# Patient Record
Sex: Female | Born: 2000 | Race: White | Hispanic: No | Marital: Single | Smoking: Never smoker
Health system: Southern US, Community
[De-identification: ages and names within clinical notes are randomized; demographics above are authoritative.]

## PROBLEM LIST (undated history)

## (undated) DIAGNOSIS — F419 Anxiety disorder, unspecified: Secondary | ICD-10-CM

## (undated) DIAGNOSIS — Z789 Other specified health status: Secondary | ICD-10-CM

## (undated) HISTORY — PX: FOOT SURGERY: SHX648

## (undated) HISTORY — DX: Other specified health status: Z78.9

## (undated) HISTORY — PX: TONSILLECTOMY: SHX5217

---
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
## 2022-08-18 DIAGNOSIS — R002 Palpitations: Secondary | ICD-10-CM | POA: Diagnosis not present

## 2022-08-18 DIAGNOSIS — N760 Acute vaginitis: Secondary | ICD-10-CM | POA: Diagnosis not present

## 2022-08-18 DIAGNOSIS — R52 Pain, unspecified: Secondary | ICD-10-CM | POA: Diagnosis not present

## 2022-11-19 DIAGNOSIS — O219 Vomiting of pregnancy, unspecified: Secondary | ICD-10-CM | POA: Diagnosis not present

## 2022-11-19 DIAGNOSIS — R55 Syncope and collapse: Secondary | ICD-10-CM | POA: Diagnosis not present

## 2022-11-19 DIAGNOSIS — Z3491 Encounter for supervision of normal pregnancy, unspecified, first trimester: Secondary | ICD-10-CM | POA: Diagnosis not present

## 2022-12-02 ENCOUNTER — Encounter: Payer: Medicaid Other | Admitting: Licensed Practical Nurse

## 2022-12-07 ENCOUNTER — Ambulatory Visit (INDEPENDENT_AMBULATORY_CARE_PROVIDER_SITE_OTHER): Payer: Medicaid Other | Admitting: Certified Nurse Midwife

## 2022-12-07 ENCOUNTER — Encounter: Payer: Self-pay | Admitting: Certified Nurse Midwife

## 2022-12-07 VITALS — BP 116/76 | HR 85 | Ht 66.0 in | Wt 151.8 lb

## 2022-12-07 DIAGNOSIS — Z3201 Encounter for pregnancy test, result positive: Secondary | ICD-10-CM

## 2022-12-07 DIAGNOSIS — N912 Amenorrhea, unspecified: Secondary | ICD-10-CM

## 2022-12-07 LAB — POCT URINE PREGNANCY: Preg Test, Ur: POSITIVE — AB

## 2022-12-07 MED ORDER — DICLEGIS 10-10 MG PO TBEC: 1.0000 | DELAYED_RELEASE_TABLET | Freq: Four times a day (QID) | ORAL | 6 refills | Status: DC | PRN

## 2022-12-07 NOTE — Patient Instructions (Signed)

## 2022-12-07 NOTE — Progress Notes (Signed)
Subjective:    Suzanne Haynes is a 22 y.o. female who presents for evaluation of amenorrhea. She believes she could be pregnant. Pregnancy is desired. Sexual Activity: single partner, contraception: none. Current symptoms also include: breast tenderness, fatigue, and nausea. Last period was normal.   No LMP recorded. The following portions of the patient's history were reviewed and updated as appropriate: allergies, current medications, past family history, past medical history, past social history, past surgical history, and problem list.  Review of Systems Pertinent items are noted in HPI.     Objective:    There were no vitals taken for this visit. General: alert, cooperative, appears stated age, and no acute distress    Lab Review Urine HCG: positive    Assessment:    Absence of menstruation.     Plan:    Pregnancy Test:  Positive: EDC: 07/25/23. Briefly discussed pre-natal care options. Encouraged well-balanced diet, plenty of rest when needed, pre-natal vitamins daily and walking for exercise. Discussed self-help for nausea, avoiding OTC medications until consulting provider or pharmacist, other than Tylenol as needed, minimal caffeine (1-2 cups daily) and avoiding alcohol. She will schedule her u/s for dating 1 wk her nurse visit @ [redacted] wks pregnant, and her initial NOB visit at [redacted] wk pregnant.  Feel free to call with any questions.   Doreene Burke, CNM

## 2022-12-08 ENCOUNTER — Telehealth: Payer: Self-pay

## 2022-12-13 ENCOUNTER — Ambulatory Visit
Admission: RE | Admit: 2022-12-13 | Discharge: 2022-12-13 | Disposition: A | Discharge status: Home / Self Care | Payer: Medicaid Other | Source: Ambulatory Visit | Attending: Certified Nurse Midwife | Admitting: Certified Nurse Midwife

## 2022-12-13 DIAGNOSIS — Z3A08 8 weeks gestation of pregnancy: Secondary | ICD-10-CM | POA: Diagnosis not present

## 2022-12-13 DIAGNOSIS — N912 Amenorrhea, unspecified: Secondary | ICD-10-CM | POA: Insufficient documentation

## 2022-12-13 DIAGNOSIS — Z3682 Encounter for antenatal screening for nuchal translucency: Secondary | ICD-10-CM | POA: Diagnosis not present

## 2022-12-13 DIAGNOSIS — Z3687 Encounter for antenatal screening for uncertain dates: Secondary | ICD-10-CM | POA: Diagnosis not present

## 2022-12-20 ENCOUNTER — Other Ambulatory Visit: Payer: Self-pay

## 2022-12-20 MED ORDER — BONJESTA 20-20 MG PO TBCR: 2.0000 | EXTENDED_RELEASE_TABLET | Freq: Every day | ORAL | 1 refills | Status: DC

## 2022-12-20 MED ORDER — ONDANSETRON 4 MG PO TBDP: 4.0000 mg | ORAL_TABLET | Freq: Three times a day (TID) | ORAL | 0 refills | Status: DC | PRN

## 2022-12-20 NOTE — Addendum Note (Signed)
Addended by: Tommie Raymond on: 12/20/2022 03:53 PM   Modules accepted: Orders

## 2022-12-20 NOTE — Telephone Encounter (Signed)
I changed RX to [facility] to see if this is covered by Medicaid.

## 2022-12-28 ENCOUNTER — Ambulatory Visit: Payer: Medicaid Other

## 2022-12-30 ENCOUNTER — Ambulatory Visit (INDEPENDENT_AMBULATORY_CARE_PROVIDER_SITE_OTHER): Payer: Medicaid Other

## 2022-12-30 VITALS — Wt 151.0 lb

## 2022-12-30 DIAGNOSIS — Z3480 Encounter for supervision of other normal pregnancy, unspecified trimester: Secondary | ICD-10-CM | POA: Insufficient documentation

## 2022-12-30 DIAGNOSIS — Z3689 Encounter for other specified antenatal screening: Secondary | ICD-10-CM

## 2022-12-30 NOTE — Patient Instructions (Signed)
Second Trimester of Pregnancy  The second trimester of pregnancy is from week 13 through week 27. This is months 4 through 6 of pregnancy. The second trimester is often a time when you feel your best. Your body has adjusted to being pregnant, and you begin to feel better physically. During the second trimester: Morning sickness has lessened or stopped completely. You may have more energy. You may have an increase in appetite. The second trimester is also a time when the unborn baby (fetus) is growing rapidly. At the end of the sixth month, the fetus may be up to 12 inches long and weigh about 1 pounds. You will likely begin to feel the baby move (quickening) between 16 and 20 weeks of pregnancy. Body changes during your second trimester Your body continues to go through many changes during your second trimester. The changes vary and generally return to normal after the baby is born. Physical changes Your weight will continue to increase. You will notice your lower abdomen bulging out. You may begin to get stretch marks on your hips, abdomen, and breasts. Your breasts will continue to grow and to become tender. Dark spots or blotches (chloasma or mask of pregnancy) may develop on your face. A dark line from your belly button to the pubic area (linea nigra) may appear. You may have changes in your hair. These can include thickening of your hair, rapid growth, and changes in texture. Some people also have hair loss during or after pregnancy, or hair that feels dry or thin. Health changes You may develop headaches. You may have heartburn. You may develop constipation. You may develop hemorrhoids or swollen, bulging veins (varicose veins). Your gums may bleed and may be sensitive to brushing and flossing. You may urinate more often because the fetus is pressing on your bladder. You may have back pain. This is caused by: Weight gain. Pregnancy hormones that are relaxing the joints in your  pelvis. A shift in weight and the muscles that support your balance. Follow these instructions at home: Medicines Follow your health care provider's instructions regarding medicine use. Specific medicines may be either safe or unsafe to take during pregnancy. Do not take any medicines unless approved by your health care provider. Take a prenatal vitamin that contains at least 600 micrograms (mcg) of folic acid. Eating and drinking Eat a healthy diet that includes fresh fruits and vegetables, whole grains, good sources of protein such as meat, eggs, or tofu, and low-fat dairy products. Avoid raw meat and unpasteurized juice, milk, and cheese. These carry germs that can harm you and your baby. You may need to take these actions to prevent or treat constipation: Drink enough fluid to keep your urine pale yellow. Eat foods that are high in fiber, such as beans, whole grains, and fresh fruits and vegetables. Limit foods that are high in fat and processed sugars, such as fried or sweet foods. Activity Exercise only as directed by your health care provider. Most people can continue their usual exercise routine during pregnancy. Try to exercise for 30 minutes at least 5 days a week. Stop exercising if you develop contractions in your uterus. Stop exercising if you develop pain or cramping in the lower abdomen or lower back. Avoid exercising if it is very hot or humid or if you are at a high altitude. Avoid heavy lifting. If you choose to, you may have sex unless your health care provider tells you not to. Relieving pain and discomfort Wear a supportive   bra to prevent discomfort from breast tenderness. Take warm sitz baths to soothe any pain or discomfort caused by hemorrhoids. Use hemorrhoid cream if your health care provider approves. Rest with your legs raised (elevated) if you have leg cramps or low back pain. If you develop varicose veins: Wear support hose as told by your health care  provider. Elevate your feet for 15 minutes, 3-4 times a day. Limit salt in your diet. Safety Wear your seat belt at all times when driving or riding in a car. Talk with your health care provider if someone is verbally or physically abusive to you. Lifestyle Do not use hot tubs, steam rooms, or saunas. Do not douche. Do not use tampons or scented sanitary pads. Avoid cat litter boxes and soil used by cats. These carry germs that can cause birth defects in the baby and possibly loss of the fetus by miscarriage or stillbirth. Do not use herbal remedies, alcohol, illegal drugs, or medicines that are not approved by your health care provider. Chemicals in these products can harm your baby. Do not use any products that contain nicotine or tobacco, such as cigarettes, e-cigarettes, and chewing tobacco. If you need help quitting, ask your health care provider. General instructions During a routine prenatal visit, your health care provider will do a physical exam and other tests. He or she will also discuss your overall health. Keep all follow-up visits. This is important. Ask your health care provider for a referral to a local prenatal education class. Ask for help if you have counseling or nutritional needs during pregnancy. Your health care provider can offer advice or refer you to specialists for help with various needs. Where to find more information American Pregnancy Association: americanpregnancy.org American College of Obstetricians and Gynecologists: acog.org/en/Womens%20Health/Pregnancy Office on Women's Health: womenshealth.gov/pregnancy Contact a health care provider if you have: A headache that does not go away when you take medicine. Vision changes or you see spots in front of your eyes. Mild pelvic cramps, pelvic pressure, or nagging pain in the abdominal area. Persistent nausea, vomiting, or diarrhea. A bad-smelling vaginal discharge or foul-smelling urine. Pain when you  urinate. Sudden or extreme swelling of your face, hands, ankles, feet, or legs. A fever. Get help right away if you: Have fluid leaking from your vagina. Have spotting or bleeding from your vagina. Have severe abdominal cramping or pain. Have difficulty breathing. Have chest pain. Have fainting spells. Have not felt your baby move for the time period told by your health care provider. Have new or increased pain, swelling, or redness in an arm or leg. Summary The second trimester of pregnancy is from week 13 through week 27 (months 4 through 6). Do not use herbal remedies, alcohol, illegal drugs, or medicines that are not approved by your health care provider. Chemicals in these products can harm your baby. Exercise only as directed by your health care provider. Most people can continue their usual exercise routine during pregnancy. Keep all follow-up visits. This is important. This information is not intended to replace advice given to you by your health care provider. Make sure you discuss any questions you have with your health care provider. Document Revised: 08/21/2019 Document Reviewed: 06/27/2019 Elsevier Patient Education  2024 [employer]. First Trimester of Pregnancy  The first trimester of pregnancy starts on the first day of your last menstrual period until the end of week 12. This is also called months 1 through 3 of pregnancy. Body changes during your first trimester Your   body goes through many changes during pregnancy. The changes usually return to normal after your baby is born. Physical changes You may gain or lose weight. Your breasts may grow larger and hurt. The area around your nipples may get darker. Dark spots or blotches may develop on your face. You may have changes in your hair. Health changes You may feel like you might vomit (nauseous), and you may vomit. You may have heartburn. You may have headaches. You may have trouble pooping (constipation). Your  gums may bleed. Other changes You may get tired easily. You may pee (urinate) more often. Your menstrual periods will stop. You may not feel hungry. You may want to eat certain kinds of food. You may have changes in your emotions from day to day. You may have more dreams. Follow these instructions at home: Medicines Take over-the-counter and prescription medicines only as told by your doctor. Some medicines are not safe during pregnancy. Take a prenatal vitamin that contains at least 600 micrograms (mcg) of folic acid. Eating and drinking Eat healthy meals that include: Fresh fruits and vegetables. Whole grains. Good sources of protein, such as meat, eggs, or tofu. Low-fat dairy products. Avoid raw meat and unpasteurized juice, milk, and cheese. If you feel like you may vomit, or you vomit: Eat 4 or 5 small meals a day instead of 3 large meals. Try eating a few soda crackers. Drink liquids between meals instead of during meals. You may need to take these actions to prevent or treat trouble pooping: Drink enough fluids to keep your pee (urine) pale yellow. Eat foods that are high in fiber. These include beans, whole grains, and fresh fruits and vegetables. Limit foods that are high in fat and sugar. These include fried or sweet foods. Activity Exercise only as told by your doctor. Most people can do their usual exercise routine during pregnancy. Stop exercising if you have cramps or pain in your lower belly (abdomen) or low back. Do not exercise if it is too hot or too humid, or if you are in a place of great height (high altitude). Avoid heavy lifting. If you choose to, you may have sex unless your doctor tells you not to. Relieving pain and discomfort Wear a good support bra if your breasts are sore. Rest with your legs raised (elevated) if you have leg cramps or low back pain. If you have bulging veins (varicose veins) in your legs: Wear support hose as told by your  doctor. Raise your feet for 15 minutes, 3-4 times a day. Limit salt in your food. Safety Wear your seat belt at all times when you are in a car. Talk with your doctor if someone is hurting you or yelling at you. Talk with your doctor if you are feeling sad or have thoughts of hurting yourself. Lifestyle Do not use hot tubs, steam rooms, or saunas. Do not douche. Do not use tampons or scented sanitary pads. Do not use herbal medicines, illegal drugs, or medicines that are not approved by your doctor. Do not drink alcohol. Do not smoke or use any products that contain nicotine or tobacco. If you need help quitting, ask your doctor. Avoid cat litter boxes and soil that is used by cats. These carry germs that can cause harm to the baby and can cause a loss of your baby by miscarriage or stillbirth. General instructions Keep all follow-up visits. This is important. Ask for help if you need counseling or if you need help with   nutrition. Your doctor can give you advice or tell you where to go for help. Visit your dentist. At home, brush your teeth with a soft toothbrush. Floss gently. Write down your questions. Take them to your prenatal visits. Where to find more information American Pregnancy Association: americanpregnancy.org American College of Obstetricians and Gynecologists: www.acog.org Office on Women's Health: womenshealth.gov/pregnancy Contact a doctor if: You are dizzy. You have a fever. You have mild cramps or pressure in your lower belly. You have a nagging pain in your belly area. You continue to feel like you may vomit, you vomit, or you have watery poop (diarrhea) for 24 hours or longer. You have a bad-smelling fluid coming from your vagina. You have pain when you pee. You are exposed to a disease that spreads from person to person, such as chickenpox, measles, Zika virus, HIV, or hepatitis. Get help right away if: You have spotting or bleeding from your vagina. You have  very bad belly cramping or pain. You have shortness of breath or chest pain. You have any kind of injury, such as from a fall or a car crash. You have new or increased pain, swelling, or redness in an arm or leg. Summary The first trimester of pregnancy starts on the first day of your last menstrual period until the end of week 12 (months 1 through 3). Eat 4 or 5 small meals a day instead of 3 large meals. Do not smoke or use any products that contain nicotine or tobacco. If you need help quitting, ask your doctor. Keep all follow-up visits. This information is not intended to replace advice given to you by your health care provider. Make sure you discuss any questions you have with your health care provider. Document Revised: 08/21/2019 Document Reviewed: 06/27/2019 Elsevier Patient Education  2024 Elsevier Inc. Commonly Asked Questions During Pregnancy  Cats: A parasite can be excreted in cat feces.  To avoid exposure you need to have another person empty the little box.  If you must empty the litter box you will need to wear gloves.  Wash your hands after handling your cat.  This parasite can also be found in raw or undercooked meat so this should also be avoided.  Colds, Sore Throats, Flu: Please check your medication sheet to see what you can take for symptoms.  If your symptoms are unrelieved by these medications please call the office.  Dental Work: Most any dental work your dentist recommends is permitted.  X-rays should only be taken during the first trimester if absolutely necessary.  Your abdomen should be shielded with a lead apron during all x-rays.  Please notify your provider prior to receiving any x-rays.  Novocaine is fine; gas is not recommended.  If your dentist requires a note from us prior to dental work please call the office and we will provide one for you.  Exercise: Exercise is an important part of staying healthy during your pregnancy.  You may continue most exercises  you were accustomed to prior to pregnancy.  Later in your pregnancy you will most likely notice you have difficulty with activities requiring balance like riding a bicycle.  It is important that you listen to your body and avoid activities that put you at a higher risk of falling.  Adequate rest and staying well hydrated are a must!  If you have questions about the safety of specific activities ask your provider.    Exposure to Children with illness: Try to avoid obvious exposure; report any   symptoms to us when noted,  If you have chicken pos, red measles or mumps, you should be immune to these diseases.   Please do not take any vaccines while pregnant unless you have checked with your OB provider.  Fetal Movement: After 28 weeks we recommend you do "kick counts" twice daily.  Lie or sit down in a calm quiet environment and count your baby movements "kicks".  You should feel your baby at least 10 times per hour.  If you have not felt 10 kicks within the first hour get up, walk around and have something sweet to eat or drink then repeat for an additional hour.  If count remains less than 10 per hour notify your provider.  Fumigating: Follow your pest control agent's advice as to how long to stay out of your home.  Ventilate the area well before re-entering.  Hemorrhoids:   Most over-the-counter preparations can be used during pregnancy.  Check your medication to see what is safe to use.  It is important to use a stool softener or fiber in your diet and to drink lots of liquids.  If hemorrhoids seem to be getting worse please call the office.   Hot Tubs:  Hot tubs Jacuzzis and saunas are not recommended while pregnant.  These increase your internal body temperature and should be avoided.  Intercourse:  Sexual intercourse is safe during pregnancy as long as you are comfortable, unless otherwise advised by your provider.  Spotting may occur after intercourse; report any bright red bleeding that is heavier  than spotting.  Labor:  If you know that you are in labor, please go to the hospital.  If you are unsure, please call the office and let us help you decide what to do.  Lifting, straining, etc:  If your job requires heavy lifting or straining please check with your provider for any limitations.  Generally, you should not lift items heavier than that you can lift simply with your hands and arms (no back muscles)  Painting:  Paint fumes do not harm your pregnancy, but may make you ill and should be avoided if possible.  Latex or water based paints have less odor than oils.  Use adequate ventilation while painting.  Permanents & Hair Color:  Chemicals in hair dyes are not recommended as they cause increase hair dryness which can increase hair loss during pregnancy.  " Highlighting" and permanents are allowed.  Dye may be absorbed differently and permanents may not hold as well during pregnancy.  Sunbathing:  Use a sunscreen, as skin burns easily during pregnancy.  Drink plenty of fluids; avoid over heating.  Tanning Beds:  Because their possible side effects are still unknown, tanning beds are not recommended.  Ultrasound Scans:  Routine ultrasounds are performed at approximately 20 weeks.  You will be able to see your baby's general anatomy an if you would like to know the gender this can usually be determined as well.  If it is questionable when you conceived you may also receive an ultrasound early in your pregnancy for dating purposes.  Otherwise ultrasound exams are not routinely performed unless there is a medical necessity.  Although you can request a scan we ask that you pay for it when conducted because insurance does not cover " patient request" scans.  Work: If your pregnancy proceeds without complications you may work until your due date, unless your physician or employer advises otherwise.  Round Ligament Pain/Pelvic Discomfort:  Sharp, shooting pains not associated   with bleeding are  fairly common, usually occurring in the second trimester of pregnancy.  They tend to be worse when standing up or when you remain standing for long periods of time.  These are the result of pressure of certain pelvic ligaments called "round ligaments".  Rest, Tylenol and heat seem to be the most effective relief.  As the womb and fetus grow, they rise out of the pelvis and the discomfort improves.  Please notify the office if your pain seems different than that described.  It may represent a more serious condition.  Common Medications Safe in Pregnancy  Acne:      Constipation:  Benzoyl Peroxide     Colace  Clindamycin      Dulcolax Suppository  Topica Erythromycin     Fibercon  Salicylic Acid      Metamucil         Miralax AVOID:        Senakot   Accutane    Cough:  Retin-A       Cough Drops  Tetracycline      Phenergan w/ Codeine if Rx  Minocycline      Robitussin (Plain & DM)  Antibiotics:     Crabs/Lice:  Ceclor       RID  Cephalosporins    AVOID:  E-Mycins      Kwell  Keflex  Macrobid/Macrodantin   Diarrhea:  Penicillin      Kao-Pectate  Zithromax      Imodium AD         PUSH FLUIDS AVOID:       Cipro     Fever:  Tetracycline      Tylenol (Regular or Extra  Minocycline       Strength)  Levaquin      Extra Strength-Do not          Exceed 8 tabs/24 hrs Caffeine:        <200mg/day (equiv. To 1 cup of coffee or  approx. 3 12 oz sodas)         Gas: Cold/Hayfever:       Gas-X  Benadryl      Mylicon  Claritin       Phazyme  **Claritin-D        Chlor-Trimeton    Headaches:  Dimetapp      ASA-Free Excedrin  Drixoral-Non-Drowsy     Cold Compress  Mucinex (Guaifenasin)     Tylenol (Regular or Extra  Sudafed/Sudafed-12 Hour     Strength)  **Sudafed PE Pseudoephedrine   Tylenol Cold & Sinus     Vicks Vapor Rub  Zyrtec  **AVOID if Problems With Blood Pressure         Heartburn: Avoid lying down for at least 1 hour after meals  Aciphex      Maalox     Rash:  Milk of  Magnesia     Benadryl    Mylanta       1% Hydrocortisone Cream  Pepcid  Pepcid Complete   Sleep Aids:  Prevacid      Ambien   Prilosec       Benadryl  Rolaids       Chamomile Tea  Tums (Limit 4/day)     Unisom         Tylenol PM         Warm milk-add vanilla or  Hemorrhoids:       Sugar for taste  Anusol/Anusol H.C.  (RX: Analapram 2.5%)  Sugar Substitutes:    Hydrocortisone OTC     Ok in moderation  Preparation H      Tucks        Vaseline lotion applied to tissue with wiping    Herpes:     Throat:  Acyclovir      Oragel  Famvir  Valtrex     Vaccines:         Flu Shot Leg Cramps:       *Gardasil  Benadryl      Hepatitis A         Hepatitis B Nasal Spray:       Pneumovax  Saline Nasal Spray     Polio Booster         Tetanus Nausea:       Tuberculosis test or PPD  Vitamin B6 25 mg TID   AVOID:    Dramamine      *Gardasil  Emetrol       Live Poliovirus  Ginger Root 250 mg QID    MMR (measles, mumps &  High Complex Carbs @ Bedtime    rebella)  Sea Bands-Accupressure    Varicella (Chickenpox)  Unisom 1/2 tab TID     *No known complications           If received before Pain:         Known pregnancy;   Darvocet       Resume series after  Lortab        Delivery  Percocet    Yeast:   Tramadol      Femstat  Tylenol 3      Gyne-lotrimin  Ultram       Monistat  Vicodin           MISC:         All Sunscreens           Hair Coloring/highlights          Insect Repellant's          (Including DEET)         Mystic Tans  

## 2022-12-30 NOTE — Progress Notes (Signed)
New OB Intake  I connected with  Suzanne Haynes on 12/30/22 at  2:15 PM EDT by telephone  and verified that I am speaking with the correct person using two identifiers. Nurse is located at [facility] and pt is located at home.  I explained I am completing New OB Intake today. We discussed her EDD of 07/25/2023 that is based on LMP of 10/18/2022. Pt is G1/P0. I reviewed her allergies, medications, Medical/Surgical/OB history, and appropriate screenings. There are cats in the home.  FOB changes the litter box.   Based on history, this is a/an pregnancy uncomplicated .   Patient Active Problem List   Diagnosis Date Noted   Supervision of other normal pregnancy, antepartum 12/30/2022    Concerns addressed today None  Delivery Plans:  Plans to deliver at [facility] [place].  Pt aware [facility] is the only hospital we deliver at.  If she goes elsewhere she will have someone deliver her that she hasn't met before.  Anatomy US Explained first scheduled [place] was done Sept 17th and an anatomy scan will be done at 20 weeks.  Labs Discussed genetic screening with patient. Patient denies genetic testing to be drawn at new OB visit. Discussed possible labs to be drawn at new OB appointment.  COVID Vaccine Patient has not had COVID vaccine.   Social Determinants of Health Food Insecurity: expresses food insecurity. Information given on local food banks. Transportation: Patient denies transportation needs.  First visit review I reviewed new OB appt with pt. I explained she will have ob bloodwork and pap smear/pelvic exam if indicated. Explained pt will be seen by Hartley Barefoot, CNM at first visit; encounter routed to appropriate provider.   Loran Senters, [facility] 12/30/2022  2:33 PM

## 2023-01-11 ENCOUNTER — Ambulatory Visit: Payer: Medicaid Other | Admitting: Certified Nurse Midwife

## 2023-01-11 ENCOUNTER — Encounter: Payer: Self-pay | Admitting: Certified Nurse Midwife

## 2023-01-11 ENCOUNTER — Other Ambulatory Visit (HOSPITAL_COMMUNITY)
Admission: RE | Admit: 2023-01-11 | Discharge: 2023-01-11 | Disposition: A | Discharge status: Home / Self Care | Payer: Medicaid Other | Source: Ambulatory Visit | Attending: Certified Nurse Midwife | Admitting: Certified Nurse Midwife

## 2023-01-11 VITALS — BP 117/57 | HR 76 | Wt 150.8 lb

## 2023-01-11 DIAGNOSIS — Z3481 Encounter for supervision of other normal pregnancy, first trimester: Secondary | ICD-10-CM | POA: Diagnosis not present

## 2023-01-11 DIAGNOSIS — Z369 Encounter for antenatal screening, unspecified: Secondary | ICD-10-CM | POA: Insufficient documentation

## 2023-01-11 DIAGNOSIS — Z114 Encounter for screening for human immunodeficiency virus [HIV]: Secondary | ICD-10-CM

## 2023-01-11 DIAGNOSIS — Z113 Encounter for screening for infections with a predominantly sexual mode of transmission: Secondary | ICD-10-CM | POA: Insufficient documentation

## 2023-01-11 DIAGNOSIS — Z3A12 12 weeks gestation of pregnancy: Secondary | ICD-10-CM | POA: Diagnosis not present

## 2023-01-11 DIAGNOSIS — Z3401 Encounter for supervision of normal first pregnancy, first trimester: Secondary | ICD-10-CM | POA: Insufficient documentation

## 2023-01-11 MED ORDER — ONDANSETRON 4 MG PO TBDP: 4.0000 mg | ORAL_TABLET | Freq: Three times a day (TID) | ORAL | 2 refills | Status: DC | PRN

## 2023-01-11 MED ORDER — PRENATAL 27-0.8 MG PO TABS: 1.0000 | ORAL_TABLET | Freq: Every day | ORAL | 4 refills | Status: DC

## 2023-01-11 NOTE — Patient Instructions (Signed)
First Trimester of Pregnancy  The first trimester of pregnancy starts on the first day of your last menstrual period until the end of week 12. This is months 1 through 3 of pregnancy. A week after a sperm fertilizes an egg, the egg will implant into the wall of the uterus and begin to develop into a baby. By the end of 12 weeks, all the baby's organs will be formed and the baby will be 2-3 inches in size. Body changes during your first trimester Your body goes through many changes during pregnancy. The changes vary and generally return to normal after your baby is born. Physical changes You may gain or lose weight. Your breasts may begin to grow larger and become tender. The tissue that surrounds your nipples (areola) may become darker. Dark spots or blotches (chloasma or mask of pregnancy) may develop on your face. You may have changes in your hair. These can include thickening or thinning of your hair or changes in texture. Health changes You may feel nauseous, and you may vomit. You may have heartburn. You may develop headaches. You may develop constipation. Your gums may bleed and may be sensitive to brushing and flossing. Other changes You may tire easily. You may urinate more often. Your menstrual periods will stop. You may have a loss of appetite. You may develop cravings for certain kinds of food. You may have changes in your emotions from day to day. You may have more vivid and strange dreams. Follow these instructions at home: Medicines Follow your health care provider's instructions regarding medicine use. Specific medicines may be either safe or unsafe to take during pregnancy. Do not take any medicines unless told to by your health care provider. Take a prenatal vitamin that contains at least 600 micrograms (mcg) of folic acid. Eating and drinking Eat a healthy diet that includes fresh fruits and vegetables, whole grains, good sources of protein such as meat, eggs, or tofu,  and low-fat dairy products. Avoid raw meat and unpasteurized juice, milk, and cheese. These carry germs that can harm you and your baby. If you feel nauseous or you vomit: Eat 4 or 5 small meals a day instead of 3 large meals. Try eating a few soda crackers. Drink liquids between meals instead of during meals. You may need to take these actions to prevent or treat constipation: Drink enough fluid to keep your urine pale yellow. Eat foods that are high in fiber, such as beans, whole grains, and fresh fruits and vegetables. Limit foods that are high in fat and processed sugars, such as fried or sweet foods. Activity Exercise only as directed by your health care provider. Most people can continue their usual exercise routine during pregnancy. Try to exercise for 30 minutes at least 5 days a week. Stop exercising if you develop pain or cramping in the lower abdomen or lower back. Avoid exercising if it is very hot or humid or if you are at high altitude. Avoid heavy lifting. If you choose to, you may have sex unless your health care provider tells you not to. Relieving pain and discomfort Wear a good support bra to relieve breast tenderness. Rest with your legs elevated if you have leg cramps or low back pain. If you develop bulging veins (varicose veins) in your legs: Wear support hose as told by your health care provider. Elevate your feet for 15 minutes, 3-4 times a day. Limit salt in your diet. Safety Wear your seat belt at all times when  driving or riding in a car. Talk with your health care provider if someone is verbally or physically abusive to you. Talk with your health care provider if you are feeling sad or have thoughts of hurting yourself. Lifestyle Do not use hot tubs, steam rooms, or saunas. Do not douche. Do not use tampons or scented sanitary pads. Do not use herbal remedies, alcohol, illegal drugs, or medicines that are not approved by your health care provider. Chemicals  in these products can harm your baby. Do not use any products that contain nicotine or tobacco, such as cigarettes, e-cigarettes, and chewing tobacco. If you need help quitting, ask your health care provider. Avoid cat litter boxes and soil used by cats. These carry germs that can cause birth defects in the baby and possibly loss of the unborn baby (fetus) by miscarriage or stillbirth. General instructions During routine prenatal visits in the first trimester, your health care provider will do a physical exam, perform necessary tests, and ask you how things are going. Keep all follow-up visits. This is important. Ask for help if you have counseling or nutritional needs during pregnancy. Your health care provider can offer advice or refer you to specialists for help with various needs. Schedule a dentist appointment. At home, brush your teeth with a soft toothbrush. Floss gently. Write down your questions. Take them to your prenatal visits. Where to find more information American Pregnancy Association: americanpregnancy.org [employer] of Obstetricians and Gynecologists: https://www.todd-brady.net/ Office on [employer] Health: MightyReward.co.nz Contact a health care provider if you have: Dizziness. A fever. Mild pelvic cramps, pelvic pressure, or nagging pain in the abdominal area. Nausea, vomiting, or diarrhea that lasts for 24 hours or longer. A bad-smelling vaginal discharge. Pain when you urinate. Known exposure to a contagious illness, such as chickenpox, measles, Zika virus, HIV, or hepatitis. Get help right away if you have: Spotting or bleeding from your vagina. Severe abdominal cramping or pain. Shortness of breath or chest pain. Any kind of trauma, such as from a fall or a car crash. New or increased pain, swelling, or redness in an arm or leg. Summary The first trimester of pregnancy starts on the first day of your last menstrual period until the end of week  12 (months 1 through 3). Eating 4 or 5 small meals a day rather than 3 large meals may help to relieve nausea and vomiting. Do not use any products that contain nicotine or tobacco, such as cigarettes, e-cigarettes, and chewing tobacco. If you need help quitting, ask your health care provider. Keep all follow-up visits. This is important. This information is not intended to replace advice given to you by your health care provider. Make sure you discuss any questions you have with your health care provider. Document Revised: 08/21/2019 Document Reviewed: 06/27/2019 Elsevier Patient Education  2024 [employer].

## 2023-01-11 NOTE — Progress Notes (Signed)
NEW OB HISTORY AND PHYSICAL  SUBJECTIVE:       Suzanne Haynes is a 22 y.o. G1P0 female, Patient's last menstrual period was 10/18/2022 (exact date)., Estimated Date of Delivery: 07/25/23, [redacted]w[redacted]d, presents today for establishment of Prenatal Care. She reports nausea, fatigue, occasional headaches, breast tenderness. Denies vaginal bleeding, dysuria, changes to vaginal discharge.   Social history Partner/Relationship: Suzanne Haynes Living situation: fiancee Work: homemaker Exercise: not regular Substance use: no T/E/D  Indications for ASA therapy (per uptodate) One of the following: Previous pregnancy with preeclampsia, especially early onset and with an adverse outcome No Multifetal gestation No Chronic hypertension No Type 1 or 2 diabetes mellitus No Chronic kidney disease No Autoimmune disease (antiphospholipid syndrome, systemic lupus erythematosus) No  Two or more of the following: Nulliparity Yes Obesity (body mass index >30 kg/m2) No Family history of preeclampsia in mother or sister No Age >=35 years No Sociodemographic characteristics (African American race, low socioeconomic level) No Personal risk factors (eg, previous pregnancy with low birth weight or small for gestational age infant, previous adverse pregnancy outcome [eg, stillbirth], interval >10 years between pregnancies) No   Gynecologic History Patient's last menstrual period was 10/18/2022 (exact date). Normal Contraception: none Last Pap: n/a due to age  Obstetric History OB History  Gravida Para Term Preterm AB Living  1            SAB IAB Ectopic Multiple Live Births               # Outcome Date GA Lbr Len/2nd Weight Sex Type Anes PTL Lv  1 Current             No past medical history on file.  Past Surgical History:  Procedure Laterality Date   FOOT SURGERY Right    growth shaved down or taken out; 3d Grade   TONSIL[age]OMY     age [age]    Current Outpatient Medications on File Prior to  Visit  Medication Sig Dispense Refill   Doxylamine-Pyridoxine (DICLEGIS) 10-10 MG TBEC Take 1 tablet by mouth as needed.     ondansetron (ZOFRAN-ODT) 4 MG disintegrating tablet Take 1 tablet (4 mg total) by mouth every 8 (eight) hours as needed for nausea or vomiting. 20 tablet 0   Prenatal Vit-Fe Fumarate-FA (MULTIVITAMIN-PRENATAL) 27-0.8 MG TABS tablet Take 1 tablet by mouth daily at 12 noon.     fluticasone (FLONASE) 50 MCG/ACT nasal spray Inhale one spray into each nostril once daily for allergy symptom control; rinse mouth and spit after use (Patient not taking: Reported on 10/26/2015) 16 g 12   LARIN FE 1.5/30 1.5-30 MG-MCG tablet Take 1 tablet by mouth once daily (Patient not taking: Reported on 12/30/2022) 84 tablet 0   No current facility-administered medications on file prior to visit.    No Known Allergies  Social History   Socioeconomic History   Marital status: Single    Spouse name: Not on file   Number of children: 0   Years of education: 11   Highest education level: Not on file  Occupational History   Occupation: unemployed  Tobacco Use   Smoking status: Never   Smokeless tobacco: Never   Tobacco comments:    mom and stepdad smoke inside and out of house  Vaping Use   Vaping status: Never Used  Substance and Sexual Activity   Alcohol use: Not Currently    Comment: occasionally   Drug use: Never   Sexual activity: Yes    Partners: Male  Birth control/protection: None  Other Topics Concern   Not on file  Social History Narrative   ** Merged History Encounter **       Social Determinants of Health   Financial Resource Strain: Low Risk  (12/30/2022)   Overall Financial Resource Strain (CARDIA)    Difficulty of Paying Living Expenses: Not very hard  Food Insecurity: Food Insecurity Present (12/30/2022)   Hunger Vital Sign    Worried About Running Out of Food in the Last Year: Sometimes true    Ran Out of Food in the Last Year: Sometimes true   Transportation Needs: No Transportation Needs (12/30/2022)   PRAPARE - Administrator, Civil Service (Medical): No    Lack of Transportation (Non-Medical): No  Physical Activity: Inactive (12/30/2022)   Exercise Vital Sign    Days of Exercise per Week: 0 days    Minutes of Exercise per Session: 0 min  Stress: No Stress Concern Present (12/30/2022)   [employer] of Occupational Health - Occupational Stress Questionnaire    Feeling of Stress : Not at all  Social Connections: Moderately Isolated (12/30/2022)   Social Connection and Isolation Panel [NHANES]    Frequency of Communication with Friends and Family: More than three times a week    Frequency of Social Gatherings with Friends and Family: Three times a week    Attends Religious Services: Never    Active Member of Clubs or Organizations: No    Attends Banker Meetings: Never    Marital Status: Living with partner  Intimate Partner Violence: Not At Risk (12/30/2022)   Humiliation, Afraid, Rape, and Kick questionnaire    Fear of Current or Ex-Partner: No    Emotionally Abused: No    Physically Abused: No    Sexually Abused: No    Family History  Problem Relation Age of Onset   Depression Mother    Anxiety disorder Mother    Scoliosis Mother    Depression Father    Drug abuse Father    Alcoholism Father    ODD Sister    ADD / ADHD Sister    Paranoid behavior Brother    Schizophrenia Brother    ADD / ADHD Brother    ODD Brother    Healthy Maternal Grandmother    Healthy Maternal Grandfather    Healthy Paternal Grandmother    Healthy Paternal Grandfather     The following portions of the patient's history were reviewed and updated as appropriate: allergies, current medications, past OB history, past medical history, past surgical history, past family history, past social history, and problem list.  Constitutional: Endorses fatigue. Denied constitutional symptoms, night sweats, recent illness,  fever, insomnia and weight loss.  Eyes: Denied eye symptoms, vision change and visual disturbance.  Ears/Nose/Throat/Neck: Denied ear, nose, throat or neck symptoms.  Cardiovascular: Denied cardiovascular symptoms, arrhythmia, chest pain/pressure, edema, exercise intolerance, orthopnea and palpitations.  Respiratory: Denied pulmonary symptoms, asthma, pleuritic pain, productive sputum, cough, dyspnea and wheezing.  Gastrointestinal: Endorses nausea & occasional vomiting, improved on medications.  Genitourinary: Denied genitourinary symptoms including symptomatic vaginal discharge, pelvic relaxation issues, and urinary complaints.  Musculoskeletal: Denied musculoskeletal symptoms, stiffness, swelling, muscle weakness and myalgia.  Dermatologic: Denied dermatology symptoms, rash and scar.  Neurologic: Denied neurology symptoms, dizziness, headache, neck pain and syncope.  Psychiatric: Denied psychiatric symptoms, anxiety and depression.  Endocrine: Denied endocrine symptoms including hot flashes and night sweats.     OBJECTIVE: Initial Physical Exam (New OB)  GENERAL APPEARANCE: alert,  well appearing, in no apparent distress, oriented to person, place and time HEAD: normocephalic, atraumatic MOUTH: mucous membranes moist, pharynx normal without lesions THYROID: no thyromegaly or masses present BREASTS: no masses noted, no significant tenderness, no palpable axillary nodes, no skin changes LUNGS: clear to auscultation, no wheezes, rales or rhonchi, symmetric air entry HEART: regular rate and rhythm, no murmurs ABDOMEN: soft, nontender, nondistended, no abnormal masses, no epigastric pain NEUROLOGIC: alert, oriented, normal speech, no focal findings or movement disorder noted  PELVIC EXAM deferred  ASSESSMENT: Normal pregnancy   PLAN: Routine prenatal care. We discussed an overview of prenatal care and when to call. Reviewed diet, exercise, and weight gain recommendations in pregnancy.  Discussed benefits of breastfeeding and lactation resources at [facility]. I reviewed labs and answered all questions. Desires genetic screening, drawn today. Welcome packet provided & reviewed, safe medication list, food safety, red flag signs reviewed.  Suzanne Haynes desires to birth at [facility] in [place] & transfer care to a practice who would attend her in labor/delivery. Will send information for other practices for transfer of care.  1. Encounter for supervision of normal first pregnancy in first trimester   Dominica Severin, CNM

## 2023-01-12 LAB — CERVICOVAGINAL ANCILLARY ONLY
Chlamydia: NEGATIVE
Comment: NEGATIVE
Comment: NORMAL
Neisseria Gonorrhea: NEGATIVE

## 2023-01-12 LAB — URINALYSIS, ROUTINE W REFLEX MICROSCOPIC
Bilirubin, UA: NEGATIVE
Glucose, UA: NEGATIVE
Ketones, UA: NEGATIVE
Leukocytes,UA: NEGATIVE
Nitrite, UA: NEGATIVE
Protein,UA: NEGATIVE
RBC, UA: NEGATIVE
Specific Gravity, UA: 1.006 (ref 1.005–1.030)
Urobilinogen, Ur: 0.2 mg/dL (ref 0.2–1.0)
pH, UA: 7 (ref 5.0–7.5)

## 2023-01-13 LAB — CBC/D/PLT+RPR+RH+ABO+RUBIGG...
Antibody Screen: NEGATIVE
Basophils Absolute: 0 10*3/uL (ref 0.0–0.2)
Basos: 0 %
EOS (ABSOLUTE): 0.1 10*3/uL (ref 0.0–0.4)
Eos: 1 %
HCV Ab: NONREACTIVE
HIV Screen 4th Generation wRfx: NONREACTIVE
Hematocrit: 36.1 % (ref 34.0–46.6)
Hemoglobin: 11.6 g/dL (ref 11.1–15.9)
Hepatitis B Surface Ag: NEGATIVE
Immature Grans (Abs): 0 10*3/uL (ref 0.0–0.1)
Immature Granulocytes: 0 %
Lymphocytes Absolute: 2 10*3/uL (ref 0.7–3.1)
Lymphs: 19 %
MCH: 29.5 pg (ref 26.6–33.0)
MCHC: 32.1 g/dL (ref 31.5–35.7)
MCV: 92 fL (ref 79–97)
Monocytes Absolute: 0.7 10*3/uL (ref 0.1–0.9)
Monocytes: 7 %
Neutrophils Absolute: 7.7 10*3/uL — ABNORMAL HIGH (ref 1.4–7.0)
Neutrophils: 73 %
Platelets: 286 10*3/uL (ref 150–450)
RBC: 3.93 x10E6/uL (ref 3.77–5.28)
RDW: 12.7 % (ref 11.7–15.4)
RPR Ser Ql: NONREACTIVE
Rh Factor: POSITIVE
Rubella Antibodies, IGG: 3.6 {index} (ref >0.99)
Varicella zoster IgG: NONREACTIVE
WBC: 10.6 10*3/uL (ref 3.4–10.8)

## 2023-01-13 LAB — HGB FRACTIONATION CASCADE
Hgb A2: 2.6 % (ref 1.8–3.2)
Hgb A: 97.4 % (ref 96.4–98.8)
Hgb F: 0 % (ref 0.0–2.0)
Hgb S: 0 %

## 2023-01-13 LAB — URINE CULTURE, OB REFLEX

## 2023-01-13 LAB — MONITOR DRUG PROFILE 14(MW)
Amphetamine Scrn, Ur: NEGATIVE ng/mL
BARBITURATE SCREEN URINE: NEGATIVE ng/mL
BENZODIAZEPINE SCREEN, URINE: NEGATIVE ng/mL
Buprenorphine, Urine: NEGATIVE ng/mL
CANNABINOIDS UR QL SCN: NEGATIVE ng/mL
Cocaine (Metab) Scrn, Ur: NEGATIVE ng/mL
Creatinine(Crt), U: 24.5 mg/dL (ref 20.0–300.0)
Fentanyl, Urine: NEGATIVE pg/mL
Meperidine Screen, Urine: NEGATIVE ng/mL
Methadone Screen, Urine: NEGATIVE ng/mL
OXYCODONE+OXYMORPHONE UR QL SCN: NEGATIVE ng/mL
Opiate Scrn, Ur: NEGATIVE ng/mL
Ph of Urine: 6.9 (ref 4.5–8.9)
Phencyclidine Qn, Ur: NEGATIVE ng/mL
Propoxyphene Scrn, Ur: NEGATIVE ng/mL
SPECIFIC GRAVITY: 1.004
Tramadol Screen, Urine: NEGATIVE ng/mL

## 2023-01-13 LAB — CULTURE, OB URINE

## 2023-01-13 LAB — NICOTINE SCREEN, URINE: Cotinine Ql Scrn, Ur: POSITIVE ng/mL — AB

## 2023-01-13 LAB — HCV INTERPRETATION

## 2023-01-16 LAB — MATERNIT 21 PLUS CORE, BLOOD
Fetal Fraction: 15
Result (T21): NEGATIVE
Trisomy 13 (Patau syndrome): NEGATIVE
Trisomy 18 (Edwards syndrome): NEGATIVE
Trisomy 21 (Down syndrome): NEGATIVE

## 2023-02-08 ENCOUNTER — Encounter: Payer: Medicaid Other | Admitting: Licensed Practical Nurse

## 2023-02-08 DIAGNOSIS — Z3A16 16 weeks gestation of pregnancy: Secondary | ICD-10-CM

## 2023-02-08 DIAGNOSIS — Z3401 Encounter for supervision of normal first pregnancy, first trimester: Secondary | ICD-10-CM

## 2023-02-20 ENCOUNTER — Encounter: Payer: Self-pay | Admitting: Obstetrics and Gynecology

## 2023-02-20 ENCOUNTER — Ambulatory Visit (INDEPENDENT_AMBULATORY_CARE_PROVIDER_SITE_OTHER): Payer: Medicaid Other | Admitting: Obstetrics and Gynecology

## 2023-02-20 VITALS — BP 119/74 | HR 75 | Wt 150.0 lb

## 2023-02-20 DIAGNOSIS — Z3A17 17 weeks gestation of pregnancy: Secondary | ICD-10-CM

## 2023-02-20 DIAGNOSIS — Z3402 Encounter for supervision of normal first pregnancy, second trimester: Secondary | ICD-10-CM

## 2023-02-20 MED ORDER — DOXYLAMINE-PYRIDOXINE 10-10 MG PO TBEC
2.0000 | DELAYED_RELEASE_TABLET | Freq: Every day | ORAL | 1 refills | Status: DC
Start: 2023-02-20 — End: 2023-05-24

## 2023-02-21 NOTE — Progress Notes (Signed)
   PRENATAL VISIT NOTE  Transfer of care visit from [facility] due to desire to deliver in [place]  Subjective:  Suzanne Haynes is a 22 y.o. G1P0 at [redacted]w[redacted]d being seen today for ongoing prenatal care.  She is currently monitored for the following issues for this low-risk pregnancy and has Supervision of other normal pregnancy, antepartum on their problem list.  Patient reports no complaints.  Contractions: Irritability. Vag. Bleeding: None.   . Denies leaking of fluid.   The following portions of the patient's history were reviewed and updated as appropriate: allergies, current medications, past family history, past medical history, past social history, past surgical history and problem list.   Objective:   Vitals:   02/20/23 1315  BP: 119/74  Pulse: 75  Weight: 150 lb (68 kg)    Fetal Status: Fetal Heart Rate (bpm): 153         General:  Alert, oriented and cooperative. Patient is in no acute distress.  Skin: Skin is warm and dry. No rash noted.   Cardiovascular: Normal heart rate noted  Respiratory: Normal respiratory effort, no problems with respiration noted  Abdomen: Soft, gravid, appropriate for gestational age.  Pain/Pressure: Absent     Pelvic: Cervical exam deferred        Extremities: Normal range of motion.     Mental Status: Normal mood and affect. Normal behavior. Normal judgment and thought content.   Assessment and Plan:  Pregnancy: G1P0 at [redacted]w[redacted]d 1. [redacted] weeks gestation of pregnancy Routine care. Declines AFP, CF, SMA testing.  - [place] MFM OB COMP + 14 WK; Future  Preterm labor symptoms and general obstetric precautions including but not limited to vaginal bleeding, contractions, leaking of fluid and fetal movement were reviewed in detail with the patient. Please refer to After Visit Summary for other counseling recommendations.   Return in about 5 weeks (around 03/27/2023) for low risk ob, in person, md or app.  Future Appointments  Date Time Provider  Department Center  03/28/2023  1:15 PM [facility] NURSE [facility]  03/28/2023  1:30 PM [facility]  03/28/2023  3:30 PM Anyanwu, Jethro Bastos, MD [facility]  04/26/2023  1:10 PM Reva Bores, MD [facility]    [place] Bing, MD

## 2023-03-16 DIAGNOSIS — R202 Paresthesia of skin: Secondary | ICD-10-CM | POA: Diagnosis not present

## 2023-03-16 DIAGNOSIS — O26892 Other specified pregnancy related conditions, second trimester: Secondary | ICD-10-CM | POA: Diagnosis not present

## 2023-03-16 DIAGNOSIS — Z3A21 21 weeks gestation of pregnancy: Secondary | ICD-10-CM | POA: Diagnosis not present

## 2023-03-16 DIAGNOSIS — O368120 Decreased fetal movements, second trimester, not applicable or unspecified: Secondary | ICD-10-CM | POA: Diagnosis not present

## 2023-03-18 ENCOUNTER — Other Ambulatory Visit: Payer: Self-pay

## 2023-03-18 ENCOUNTER — Emergency Department (HOSPITAL_COMMUNITY)
Admission: EM | Admit: 2023-03-18 | Discharge: 2023-03-18 | Disposition: A | Discharge status: Home / Self Care | Payer: Medicaid Other | Attending: Emergency Medicine | Admitting: Emergency Medicine

## 2023-03-18 ENCOUNTER — Encounter (HOSPITAL_COMMUNITY): Payer: Self-pay

## 2023-03-18 ENCOUNTER — Emergency Department (HOSPITAL_COMMUNITY): Payer: Medicaid Other

## 2023-03-18 DIAGNOSIS — O26892 Other specified pregnancy related conditions, second trimester: Secondary | ICD-10-CM | POA: Diagnosis present

## 2023-03-18 DIAGNOSIS — R079 Chest pain, unspecified: Secondary | ICD-10-CM | POA: Diagnosis not present

## 2023-03-18 DIAGNOSIS — R072 Precordial pain: Secondary | ICD-10-CM | POA: Diagnosis not present

## 2023-03-18 DIAGNOSIS — R0789 Other chest pain: Secondary | ICD-10-CM | POA: Diagnosis not present

## 2023-03-18 DIAGNOSIS — O99412 Diseases of the circulatory system complicating pregnancy, second trimester: Secondary | ICD-10-CM | POA: Diagnosis not present

## 2023-03-18 DIAGNOSIS — R200 Anesthesia of skin: Secondary | ICD-10-CM | POA: Diagnosis not present

## 2023-03-18 DIAGNOSIS — Z3A21 21 weeks gestation of pregnancy: Secondary | ICD-10-CM | POA: Insufficient documentation

## 2023-03-18 LAB — BASIC METABOLIC PANEL
Anion gap: 9 (ref 5–15)
BUN: <5 mg/dL — ABNORMAL LOW (ref 6–20)
CO2: 21 mmol/L — ABNORMAL LOW (ref 22–32)
Calcium: 9 mg/dL (ref 8.9–10.3)
Chloride: 106 mmol/L (ref 98–111)
Creatinine, Ser: 0.53 mg/dL (ref 0.44–1.00)
GFR, Estimated: >60 mL/min (ref >60)
Glucose, Bld: 98 mg/dL (ref 70–99)
Potassium: 3.5 mmol/L (ref 3.5–5.1)
Sodium: 136 mmol/L (ref 135–145)

## 2023-03-18 LAB — CBC
HCT: 33.8 % — ABNORMAL LOW (ref 36.0–46.0)
Hemoglobin: 11.5 g/dL — ABNORMAL LOW (ref 12.0–15.0)
MCH: 30.7 pg (ref 26.0–34.0)
MCHC: 34 g/dL (ref 30.0–36.0)
MCV: 90.4 fL (ref 80.0–100.0)
Platelets: 282 10*3/uL (ref 150–400)
RBC: 3.74 MIL/uL — ABNORMAL LOW (ref 3.87–5.11)
RDW: 12.9 % (ref 11.5–15.5)
WBC: 18.6 10*3/uL — ABNORMAL HIGH (ref 4.0–10.5)
nRBC: 0 % (ref 0.0–0.2)

## 2023-03-18 LAB — TROPONIN I (HIGH SENSITIVITY)
Troponin I (High Sensitivity): 9 ng/L (ref <18)
Troponin I (High Sensitivity): 15 ng/L (ref <18)

## 2023-03-18 LAB — D-DIMER, QUANTITATIVE: D-Dimer, Quant: 0.92 ug{FEU}/mL — ABNORMAL HIGH (ref 0.00–0.50)

## 2023-03-18 NOTE — ED Triage Notes (Signed)
Pt reports having midsternal chest pain non-radiating that started today. Pt denies SOB, N/V, diaphoresis. Pt also c/o L arm and leg pain. Of note, pt is [redacted]w[redacted]d pregnant. G1P0. Pt denies abd cramping, no vaginal bleeding.

## 2023-03-18 NOTE — ED Provider Triage Note (Signed)
Emergency Medicine Provider Triage Evaluation Note  Marilin Galletti , a 22 y.o. female  was evaluated in triage.  Pt complains of chest pain. Pain and numbness to L arm x 2 days.  Initially seen at [facility] and was discharged.  Now report having pain to mid chest throughout the day today with mild nausea.  No sob, no cough, no abd pain, vaginal bleeding or vaginal discharge.  Family hx of heart disease. Pt is [redacted] weeks pregnant  Review of Systems  Positive: As above Negative: As above  Physical Exam  BP 128/79 (BP Location: Right Arm)   Pulse 90   Temp 98.1 F (36.7 C) (Oral)   Resp 16   LMP 10/18/2022 (Exact Date)   SpO2 100%  Gen:   Awake, no distress   Resp:  Normal effort  MSK:   Moves extremities without difficulty  Other:    Medical Decision Making  Medically screening exam initiated at 6:38 PM.  Appropriate orders placed.  Sriya Sayler was informed that the remainder of the evaluation will be completed by another provider, this initial triage assessment does not replace that evaluation, and the importance of remaining in the ED until their evaluation is complete.  Need rapid OB assessment and chest pain rule out   Fayrene Helper, PA-C 03/18/23 1844

## 2023-03-18 NOTE — Discharge Instructions (Addendum)
You have been seen here in the emergency department for chest pain. We have obtained a full history, performed a physical exam, in addition to other diagnostic tests and treatments. Right now, we feel that you are safe for discharge from a medical perspective, and do not have an acute life threatening illness.   To do: 1.) Take all medications as prescribed.   2.) If anything changes, or you develop fevers, chills, inability to eat or drink, severe pain, new symptoms, return of symptoms, worsening of symptoms, or any other concerns, please call 911 or come back to the emergency department as soon as possible.   3.) Please make an appointment with your primary care doctor for a follow-up visit after being seen here in the emergency department. If you do not have a primary care doctor, you can call [phone] for assistance in finding one or your health care insurance company.    4.) Your d-dimer was elevated but below the acceptable limit.  with is not likely that you have a blood clot. You have no signs of a heart attack. Follow up with OBGYN.    Thank you for allowing me to take care of you today. We hope that you feel better soon.

## 2023-03-18 NOTE — ED Provider Notes (Signed)
[facility] AT [facility] Provider Note  HPI   Suzanne Haynes is a 22 y.o. female patient with a PMHx of G1 P0 at 21 weeks and 4 days who is here today with concern for chest pain patient states that for the past couple days she has had some intermittent tingling of her left arm and left leg, and then today and yesterday, started having some chest pressure, it is bilateral mid sternum, does not radiate anywhere, said no nausea no vomiting no diaphoresis.  No fevers chills cough urinary symptoms is able to feel baby kick.  ROS Negative except as per HPI   Medical Decision Making   Upon presentation, the patient is afebrile hemodynamic stable no tachypnea and she is not tachycardic.  For this patient, a large workup and done been done prior to me seeing the patient.  ECG reviewed which shows normal sinus rhythm with sinus arrhythmia, chest x-ray was clear no signs of pneumonia or consolidation, hemoglobin low 11.5, however leukocytosis of 18.6.  And no major metabolic derangement.  First troponin was already negative.  I added on a second troponin which was negative, and additionally added on a D-dimer.  It is elevated at 0.92, however per the years criteria which is a validated clinical decision tool in pregnancy, she is able to get to the [facility] cutoff of oh thousand, so she can be effectively ruled out a pulmonary embolism at this time.  I see no evidence of pneumonia, no evidence of costochondritis, her chest wall is not tender, it is unclear what caused her chest pain which is now resolved.  I feel like we can safely discharge this patient having effectively rule out ACS, PE, pneumonia, pneumothorax, no recent instrumentation of his chest esophageal information.  No vaginal bleeding no vaginal discharge no other symptoms that would require any additional workup.  She will follow closely with OB/GYN  At this time, I feel that the patient is medically cleared for  discharge and have discussed this with my attending who agrees.  I discussed with the patient and/or family my overall assessment, including my physical exam, labs, imaging, other diagnostic tests, and therapeutics given.  All questions answered and understanding is expressed.  I have instructed to call PCP to establish an outpatient appointment after this ED visit, and necessary specialty follow up if needed. I gave strict return precautions to come back to the ED including fevers, chills, severe pain, worsening of symptoms, return of symptoms, new and concerning symptoms, inability to tolerate p.o. intake, among others. I specifically stated to return if symptoms worsen or return.   1. Chest pain, unspecified type     @DISPOSITION @  Rx / DC Orders ED Discharge Orders     None        Past Medical History:  Diagnosis Date   Medical history non-contributory    Past Surgical History:  Procedure Laterality Date   FOOT SURGERY Right    growth shaved down or taken out; 3d Grade   TONSIL[age]OMY     age [age]   Family History  Problem Relation Age of Onset   Depression Mother    Anxiety disorder Mother    Scoliosis Mother    Depression Father    Drug abuse Father    Alcoholism Father    ODD Sister    ADD / ADHD Sister    Paranoid behavior Brother    Schizophrenia Brother    ADD / ADHD Brother  ODD Brother    Healthy Maternal Grandmother    Healthy Maternal Grandfather    Healthy Paternal Grandmother    Healthy Paternal Grandfather    Social History   Socioeconomic History   Marital status: Single    Spouse name: Not on file   Number of children: 0   Years of education: 11   Highest education level: Not on file  Occupational History   Occupation: unemployed  Tobacco Use   Smoking status: Never   Smokeless tobacco: Never   Tobacco comments:    mom and stepdad smoke inside and out of house  Vaping Use   Vaping status: Never Used  Substance and Sexual Activity    Alcohol use: Not Currently    Comment: occasionally   Drug use: Never   Sexual activity: Yes    Partners: Male    Birth control/protection: None  Other Topics Concern   Not on file  Social History Narrative   ** Merged History Encounter **       Social Drivers of Health   Financial Resource Strain: Low Risk  (12/30/2022)   Overall Financial Resource Strain (CARDIA)    Difficulty of Paying Living Expenses: Not very hard  Food Insecurity: Food Insecurity Present (12/30/2022)   Hunger Vital Sign    Worried About Running Out of Food in the Last Year: Sometimes true    Ran Out of Food in the Last Year: Sometimes true  Transportation Needs: No Transportation Needs (12/30/2022)   PRAPARE - Administrator, Civil Service (Medical): No    Lack of Transportation (Non-Medical): No  Physical Activity: Inactive (12/30/2022)   Exercise Vital Sign    Days of Exercise per Week: 0 days    Minutes of Exercise per Session: 0 min  Stress: No Stress Concern Present (12/30/2022)   [employer] of Occupational Health - Occupational Stress Questionnaire    Feeling of Stress : Not at all  Social Connections: Moderately Isolated (12/30/2022)   Social Connection and Isolation Panel [NHANES]    Frequency of Communication with Friends and Family: More than three times a week    Frequency of Social Gatherings with Friends and Family: Three times a week    Attends Religious Services: Never    Active Member of Clubs or Organizations: No    Attends Banker Meetings: Never    Marital Status: Living with partner  Intimate Partner Violence: Not At Risk (12/30/2022)   Humiliation, Afraid, Rape, and Kick questionnaire    Fear of Current or Ex-Partner: No    Emotionally Abused: No    Physically Abused: No    Sexually Abused: No     Physical Exam   Vitals:   03/18/23 2236 03/18/23 2300 03/18/23 2309 03/18/23 2311  BP:  113/66    Pulse:  77  99  Resp:  13  (!) 27  Temp: 98.3 F  (36.8 C)     TempSrc:      SpO2:  100%  100%  Weight:   68 kg   Height:   5\' 6"  (1.676 m)     Physical Exam Vitals and nursing note reviewed.  Constitutional:      General: She is not in acute distress.    Appearance: She is well-developed.  HENT:     Head: Normocephalic and atraumatic.  Eyes:     Conjunctiva/sclera: Conjunctivae normal.  Cardiovascular:     Rate and Rhythm: Normal rate and regular rhythm.  Heart sounds: No murmur heard. Pulmonary:     Effort: Pulmonary effort is normal. No respiratory distress.     Breath sounds: Normal breath sounds.  Abdominal:     Palpations: Abdomen is soft.     Tenderness: There is no abdominal tenderness.  Musculoskeletal:        General: No swelling.     Cervical back: Neck supple.     Comments: No calf tenderness  Skin:    General: Skin is warm and dry.     Capillary Refill: Capillary refill takes less than 2 seconds.  Neurological:     Mental Status: She is alert.  Psychiatric:        Mood and Affect: Mood normal.      Procedures   If procedures were preformed on this patient, they are listed below:  Procedures  The patient was seen, evaluated, and treated in conjunction with the attending physician, who voiced agreement in the care provided.  Note generated using Dragon voice dictation software and may contain dictation errors. Please contact me for any clarification or with any questions.   Electronically signed by:  Osvaldo Shipper, M.D. (PGY-2)    Gunnar Bulla, MD 03/18/23 Criss Rosales    Eber Hong, MD 03/19/23 1102

## 2023-03-28 ENCOUNTER — Encounter: Payer: Self-pay | Admitting: *Deleted

## 2023-03-28 ENCOUNTER — Other Ambulatory Visit: Payer: Self-pay

## 2023-03-28 ENCOUNTER — Ambulatory Visit: Payer: Medicaid Other | Attending: Obstetrics and Gynecology

## 2023-03-28 ENCOUNTER — Other Ambulatory Visit: Payer: Self-pay | Admitting: *Deleted

## 2023-03-28 ENCOUNTER — Ambulatory Visit: Payer: Medicaid Other | Admitting: Maternal & Fetal Medicine

## 2023-03-28 ENCOUNTER — Ambulatory Visit (INDEPENDENT_AMBULATORY_CARE_PROVIDER_SITE_OTHER): Payer: Medicaid Other | Admitting: Obstetrics & Gynecology

## 2023-03-28 ENCOUNTER — Ambulatory Visit: Payer: Medicaid Other | Admitting: *Deleted

## 2023-03-28 ENCOUNTER — Other Ambulatory Visit: Payer: Self-pay | Admitting: Obstetrics and Gynecology

## 2023-03-28 VITALS — BP 108/71 | HR 74 | Wt 153.4 lb

## 2023-03-28 VITALS — BP 117/63 | HR 65

## 2023-03-28 DIAGNOSIS — Z3480 Encounter for supervision of other normal pregnancy, unspecified trimester: Secondary | ICD-10-CM

## 2023-03-28 DIAGNOSIS — Z3A17 17 weeks gestation of pregnancy: Secondary | ICD-10-CM

## 2023-03-28 DIAGNOSIS — Z3A23 23 weeks gestation of pregnancy: Secondary | ICD-10-CM | POA: Diagnosis not present

## 2023-03-28 DIAGNOSIS — O43192 Other malformation of placenta, second trimester: Secondary | ICD-10-CM | POA: Insufficient documentation

## 2023-03-28 DIAGNOSIS — O321XX0 Maternal care for breech presentation, not applicable or unspecified: Secondary | ICD-10-CM | POA: Diagnosis not present

## 2023-03-28 DIAGNOSIS — Z363 Encounter for antenatal screening for malformations: Secondary | ICD-10-CM | POA: Diagnosis not present

## 2023-03-28 DIAGNOSIS — Z362 Encounter for other antenatal screening follow-up: Secondary | ICD-10-CM

## 2023-03-28 DIAGNOSIS — O43193 Other malformation of placenta, third trimester: Secondary | ICD-10-CM | POA: Insufficient documentation

## 2023-03-28 MED ORDER — PRENATAL 27-0.8 MG PO TABS: 1.0000 | ORAL_TABLET | Freq: Every day | ORAL | 4 refills | Status: AC

## 2023-03-28 NOTE — Progress Notes (Signed)
   Patient information  Patient Name: Suzanne Haynes  Patient MRN:   [id number]  Referring practice: MFM Referring Provider: Belle SHIPPER  MFM CONSULT  Suzanne Haynes is a 22 y.o. G1P0 at [redacted]w[redacted]d here for ultrasound and consultation. Patient Active Problem List   Diagnosis Date Noted   Marginal insertion of umbilical cord affecting management of mother in second trimester 03/28/2023   Supervision of other normal pregnancy, antepartum 12/30/2022   I discussed the possibility of a prominent pharyngeal pouch that is likely normal.  I discussed that we will assess this area in 4 to 5 weeks.  There is no evidence of a cystic structure outside the normal anatomic location such as a brachial cleft cyst or a thyroglossal duct cyst.  I reassured the patient that this is likely a normal finding that is more prominent due to her low body mass index but we should look one more time to ensure that it is normal.  The stomach is normal which typically rules out esophageal atresia.  The remainder of the fetal structures appear normal.  The sacral spine does appear limited.  There is a marginal cord insertion as well.  Will verify this on the subsequent ultrasound.  If it is present we will recommend third trimester growth ultrasounds.  Sonographic findings Single intrauterine pregnancy at 23w 0d  Fetal cardiac activity:  Observed and appears normal. Presentation: Breech. The anatomic structures that were well seen appear normal without evidence of soft markers. Due to poor acoustic windows some structures remain suboptimally visualized. Fetal biometry shows the estimated fetal weight at the 37 percentile.  Amniotic fluid: Within normal limits.  MVP: 5.92 cm. Placenta: Anterior with marginal cord insertion.  Adnexa: No abnormality visualized. Cervical length: 3.4 cm.  Recommendations - EDD should be 07/25/2023 based on  LMP  (10/18/22). - Follow up ultrasound in 4-6 weeks to attempt visualization of the  anatomy not seen and reassess the fetal growth. - Third trimester US  can be done for marginal cord insertion.  Review of Systems: A review of systems was performed and was negative except per HPI   Vitals and Physical Exam    03/28/2023    1:15 PM 03/18/2023   11:11 PM 03/18/2023   11:09 PM  Vitals with BMI  Height   5' 6  Weight   150 lbs  BMI   24.22  Systolic 117    Diastolic 63    Pulse 65 99    Sitting comfortably on the sonogram table Nonlabored breathing Normal rate and rhythm Abdomen is nontender  Past pregnancies OB History  Gravida Para Term Preterm AB Living  1       SAB IAB Ectopic Multiple Live Births          # Outcome Date GA Lbr Len/2nd Weight Sex Type Anes PTL Lv  1 Current              I spent 20 minutes reviewing the patients chart, including labs and images as well as counseling the patient about her medical conditions. Greater than 50% of the time was spent in direct face-to-face patient counseling.  Delora Smaller  MFM, [facility]   03/28/2023  2:48 PM

## 2023-03-28 NOTE — Progress Notes (Signed)
 PRENATAL VISIT NOTE  Subjective:  Suzanne Haynes is a 22 y.o. G1P0 at [redacted]w[redacted]d being seen today for ongoing prenatal care.  She is currently monitored for the following issues for this low-risk pregnancy and has Supervision of other normal pregnancy, antepartum and Marginal insertion of umbilical cord affecting management of mother in second trimester on their problem list.  Patient reports no complaints. Had episode of anxiety, but resolved now, does not want medications or other intervention.  Contractions: Not present. Vag. Bleeding: None.  Movement: Present. Denies leaking of fluid.   The following portions of the patient's history were reviewed and updated as appropriate: allergies, current medications, past family history, past medical history, past social history, past surgical history and problem list.   Objective:   Vitals:   03/28/23 1529  BP: 108/71  Pulse: 74  Weight: 153 lb 6.4 oz (69.6 kg)    Fetal Status:     Movement: Present     General:  Alert, oriented and cooperative. Patient is in no acute distress.  Skin: Skin is warm and dry. No rash noted.   Cardiovascular: Normal heart rate noted  Respiratory: Normal respiratory effort, no problems with respiration noted  Abdomen: Soft, gravid, appropriate for gestational age.  Pain/Pressure: Absent     Pelvic: Cervical exam deferred        Extremities: Normal range of motion.  Edema: None  Mental Status: Normal mood and affect. Normal behavior. Normal judgment and thought content.   US  MFM OB DETAIL +14 WK Result Date: 03/28/2023 ----------------------------------------------------------------------  OBSTETRICS REPORT                       (Signed Final 03/28/2023 02:49 pm) ---------------------------------------------------------------------- Patient Info  ID #:       [id number]                          D.O.B.:  [birth year] (22 yrs)(F)  Name:       Suzanne Haynes                  Visit Date: 03/28/2023 01:16 pm  ---------------------------------------------------------------------- Performed By  Attending:        Delora Smaller DO       Ref. Address:     [address]                                                             [place], [place]                                                             [address]  Performed By:     Joyann Allen RDMS      Location[facility]  Referred By:      BEBE FURRY MD ---------------------------------------------------------------------- Orders  #  Description                           Code        Ordered By  1  US  MFM OB DETAIL +14 WK               76811.01    CHARLIE PICKENS ----------------------------------------------------------------------  #  Order #                     Accession #                Episode #  1  [id number]                   [id number]                 [id number] ---------------------------------------------------------------------- Indications  Marginal insertion of umbilical cord affecting O43.192  management of mother in second trimester  Smoking complicating pregnancy, second         O99.332  trimester  [redacted] weeks gestation of pregnancy                Z3A.23  Encounter for antenatal screening for          Z36.3  malformations  Low Risk NIPS ---------------------------------------------------------------------- Vital Signs  BP:          117/63 ---------------------------------------------------------------------- Fetal Evaluation  Num Of Fetuses:         1  Fetal Heart Rate(bpm):  141  Cardiac Activity:       Observed  Presentation:           Breech  Placenta:               Anterior  P. Cord Insertion:      Marginal insertion  Amniotic Fluid  AFI FV:      Within normal limits                              Largest Pocket(cm)                               5.92 ---------------------------------------------------------------------- Biometry  BPD:      55.3  mm     G. Age:  22w 6d         40  %    CI:        72.48   %  70 - 86                                                          FL/HC:      19.0   %    19.2 - 20.8  HC:      206.6  mm     G. Age:  22w 5d         26  %    HC/AC:      1.13        1.05 - 1.21  AC:      183.2  mm     G. Age:  23w 1d         46  %    FL/BPD:     70.9   %    71 - 87  FL:       39.2  mm     G. Age:  22w 4d         26  %    FL/AC:      21.4   %    20 - 24  HUM:      39.6  mm     G. Age:  24w 1d         68  %  CER:      25.6  mm     G. Age:  23w 2d         85  %  LV:        4.9  mm  CM:        2.9  mm  Est. FW:     544  gm      1 lb 3 oz     37  % ---------------------------------------------------------------------- OB History  Blood Type:   O+  Gravidity:    1 ---------------------------------------------------------------------- Gestational Age  LMP:           23w 0d        Date:  10/18/22                  EDD:   07/25/23  U/S Today:     22w 6d                                        EDD:   07/26/23  Best:          23w 0d     Det. By:  LMP  (10/18/22)          EDD:   07/25/23 ---------------------------------------------------------------------- Targeted Anatomy  Central Nervous System  Calvarium/Cranial V.:  Appears normal         Cereb./Vermis:          Appears normal  Cavum:                 Appears normal         Cisterna Magna:         Appears normal  Lateral Ventricles:    Appears normal         Midline Falx:           Appears normal  Choroid Plexus:        Appears normal  Spine  Cervical:              Appears normal         Sacral:                 Not well visualized  Thoracic:              Appears normal         Shape/Curvature:        Appears normal  Lumbar:                Appears normal  Head/Neck  Lips:                  Appears normal         Profile:                Appears normal  Neck:                   Appears normal         Orbits/Eyes:            Appears normal  Nuchal Fold:           Appears normal         Mandible:               Appears normal  Nasal Bone:            Present                Maxilla:                Appears normal  Thorax  4 Chamber View:        Appears normal         Interventr. Septum:     Appears normal  Cardiac Rhythm:        Normal                 Cardiac Axis:           Normal  Cardiac Situs:         Appears normal         Diaphragm:              Appears normal  Rt Outflow Tract:      Appears normal         3 Vessel View:          Appears normal  Lt Outflow Tract:      Appears normal         3 V Trachea View:       Appears normal  Aortic Arch:           Appears normal         IVC:                    Appears normal  Ductal Arch:           Appears normal         Crossing:               Appears normal  SVC:                   Appears normal  Abdomen  Ventral Wall:          Appears normal         Lt Kidney:              Appears  normal  Cord Insertion:        Appears normal         Rt Kidney:              Appears normal  Situs:                 Appears normal         Bladder:                Appears normal  Stomach:               Appears normal  Extremities  Lt Humerus:            Appears normal         Lt Femur:               Appears normal  Rt Humerus:            Appears normal         Rt Femur:               Appears normal  Lt Forearm:            Appears normal         Lt Lower Leg:           Appears normal  Rt Forearm:            Appears normal         Rt Lower Leg:           Appears normal  Lt Hand:               Open hand nml          Lt Foot:                Nml heel/foot  Rt Hand:               Open hand nml          Rt Foot:                Nml heel/foot  Other  Umbilical Cord:        Normal 3-vessel        Genitalia:              Female-nml ---------------------------------------------------------------------- Cervix Uterus Adnexa  Cervix  Length:            3.4  cm.  Normal appearance by  transabdominal scan  Uterus  No abnormality visualized.  Right Ovary  Not visualized.  Left Ovary  Size(cm)     3.01   x   1.18   x  1.48      Vol(ml): 2.75  Within normal limits.  Cul De Sac  No free fluid seen.  Adnexa  No abnormality visualized ---------------------------------------------------------------------- Comments  MFM CONSULT  Suzanne Haynes is a 22 y.o. G1P0 at [redacted]w[redacted]d here for  ultrasound and consultation.  Patient Active Problem List           Diagnosis         Date Noted          Supervision of other normal pregnancy, antepartum  antepartum        12/30/2022  I discussed the possibility of a prominent pharyngeal pouch  that is likely normal.  I discussed that we will assess this area  in 4 to 5 weeks.  There is no evidence of a cystic structure  outside the  normal anatomic location such as a brachial cleft  cyst or a thyroglossal duct cyst.  I reassured the patient that  this is likely a normal finding that is more prominent due to  her low body mass index but we should look one more time to  ensure that it is normal.  The stomach is normal which  typically rules out esophageal atresia.  The remainder of the  fetal structures appear normal.  The sacral spine does appear  limited.  There is a marginal cord insertion as well.  Will verify this on  the subsequent ultrasound.  If it is present we will  recommend third trimester growth ultrasounds.  Sonographic findings  Single intrauterine pregnancy at 23w 0d  Fetal cardiac activity:  Observed and appears normal.  Presentation: Breech.  The anatomic structures that were well seen appear normal  without evidence of soft markers. Due to poor acoustic  windows some structures remain suboptimally visualized.  Fetal biometry shows the estimated fetal weight at the 37  percentile.  Amniotic fluid: Within normal limits.  MVP: 5.92 cm.  Placenta: Anterior with marginal cord insertion.  Adnexa: No abnormality visualized.  Cervical length: 3.4 cm.  Recommendations   - EDD should be 07/25/2023 based on  LMP  (10/18/22).  - Follow up ultrasound in 4-6 weeks to attempt visualization of  the anatomy not seen and reassess the fetal growth.  - Third trimester US  can be done for marginal cord insertion. ----------------------------------------------------------------------                   Delora Smaller, DO Electronically Signed Final Report   03/28/2023 02:49 pm ----------------------------------------------------------------------   DG Chest 2 View Result Date: 03/18/2023 CLINICAL DATA:  Chest pain and left arm numbness. EXAM: CHEST - 2 VIEW COMPARISON:  07/21/2006 FINDINGS: Lungs are adequately inflated and otherwise clear. Cardiomediastinal silhouette, bones and soft tissues are normal. IMPRESSION: No active cardiopulmonary disease. Electronically Signed   By: Toribio Agreste M.D.   On: 03/18/2023 19:02    Assessment and Plan:  Pregnancy: G1P0 at [redacted]w[redacted]d 1. Marginal insertion of umbilical cord affecting management of mother in second trimester (Primary) Follow up scans as per MFM.  2. [redacted] weeks gestation of pregnancy 3. Supervision of other normal pregnancy, antepartum Prenatal vitamins refilled as per patient request. - Prenatal Vit-Fe Fumarate-FA (MULTIVITAMIN-PRENATAL) 27-0.8 MG TABS tablet; Take 1 tablet by mouth daily at 12 noon.  Dispense: 90 tablet; Refill: 4 Anxiety precautions advised, recommended [facility] referral, Vistaril and/or Buspar. She will research these and let us  know.   Preterm labor symptoms and general obstetric precautions including but not limited to vaginal bleeding, contractions, leaking of fluid and fetal movement were reviewed in detail with the patient. Please refer to After Visit Summary for other counseling recommendations.   Return in about 4 weeks (around 04/25/2023) for 2 hr GTT, 3rd trimester labs, TDap, OFFICE OB VISIT (MD only).  Future Appointments  Date Time Provider Department Center  04/28/2023  8:30 AM [facility] LAB [facility]  04/28/2023  8:55 AM Eldonna Suzen Octave, MD [facility]  05/05/2023  3:15 PM [facility] NURSE [facility]  05/05/2023  3:30 PM [facility] US3 [facility]  05/10/2023  1:30 PM Fredirick Glenys RAMAN, MD [facility]  05/24/2023  1:30 PM Fredirick Glenys RAMAN, MD [facility]    Gloris Hugger, MD

## 2023-03-28 NOTE — Patient Instructions (Addendum)
 Research Vistaril and/or Buspar in pregnancy for anxiety  Oral Glucose Tolerance Test During Pregnancy Why am I having this test? The oral glucose tolerance test (GTT) is done to check how your body processes blood sugar (glucose). This is one of several tests used to diagnose diabetes that develops during pregnancy (gestational diabetes mellitus). Gestational diabetes is a short-term form of diabetes that some women develop while they are pregnant. It usually occurs during the second or third trimester of pregnancy and goes away after delivery. Testing, or screening, for gestational diabetes usually occurs around 29 of pregnancy. This test may also be needed earlier if: You have a history of gestational diabetes. There is a history of giving birth to very large babies or of losing pregnancies (having stillbirths). You have signs and symptoms of diabetes, such as: Changes in your eyesight. Tingling or numbness in your hands or feet. Changes in hunger, thirst, and urination, and these are not explained by your pregnancy. What is being tested? This test measures the amount of glucose in your blood at different times during a period of 2 hours. This shows how well your body can process glucose.  You will have three separate blood draws. What kind of sample is taken?  Blood samples are required for this test. They are usually collected by inserting a needle into a blood vessel. How do I prepare for this test? For 3 days before your test, eat normally. Have plenty of carbohydrate-rich foods. You will be asked not to eat or drink anything other than water (to fast) starting 8-10 hours before the test. Tell a health care provider about: All medicines you are taking, including vitamins, herbs, eye drops, creams, and over-the-counter medicines. Any blood disorders you have. Any surgeries you have had. Any medical conditions you have. What happens during the test? First, your blood glucose will be  measured. This is referred to as your fasting blood glucose because you fasted before the test. Then, you will drink a glucose solution that contains a certain amount of glucose. Your blood glucose will be measured again 1 and 2 hours after you drink the solution. This test takes about 2 hours to complete. You will need to stay at the testing location during this time. During the testing period: Do not eat or drink anything other than the glucose solution. Do not exercise. Do not use any products that contain nicotine  or tobacco, such as cigarettes, e-cigarettes, and chewing tobacco. These can affect your test results. If you need help quitting, ask your health care provider. The testing procedure may vary among health care providers and hospitals. How are the results reported? Your results will be reported as milligrams of glucose per deciliter of blood (mg/dL) or millimoles per liter (mmol/L). There is more than one source for screening and diagnosis reference values used to diagnose gestational diabetes. Your health care provider will compare your results to normal values that were established after testing a large group of people (reference values). Reference values may vary among labs and hospitals. For this test, reference values are: Fasting: 92 mg/dL 1 hour: 819 mg/dL  2 hour: 846 mg/dL   What do the results mean? Results below the reference values are considered normal. If one or more of your blood glucose levels are at or above the reference values, you will be diagnosed with gestational diabetes.  Talk with your health care provider about what your results mean. Questions to ask your health care provider Ask your health care provider,  or the department that is doing the test: When will my results be ready? How will I get my results? What are my treatment options? What other tests do I need? What are my next steps? Summary The oral glucose tolerance test (GTT) is one of several tests  used to diagnose diabetes that develops during pregnancy (gestational diabetes mellitus). Gestational diabetes is a short-term form of diabetes that some women develop while they are pregnant. You may also have this test if you have any symptoms or risk factors for this type of diabetes. Talk with your health care provider about what your results mean. This information is not intended to replace advice given to you by your health care provider. Make sure you discuss any questions you have with your health care provider.   TDaP Vaccine Pregnancy Get the Whooping Cough Vaccine While You Are Pregnant (CDC)  It is important for women to get the whooping cough vaccine in the third trimester of each pregnancy. Vaccines are the best way to prevent this disease. There are 2 different whooping cough vaccines. Both vaccines combine protection against whooping cough, tetanus and diphtheria, but they are for different age groups: Tdap: for everyone 11 years or older, including pregnant women  DTaP: for children 2 months through 22 years of age  You need the whooping cough vaccine during each of your pregnancies The recommended time to get the shot is during your 27th through 36th week of pregnancy, preferably during the earlier part of this time period. The Centers for Disease Control and Prevention (CDC) recommends that pregnant women receive the whooping cough vaccine for adolescents and adults (called Tdap vaccine) during the third trimester of each pregnancy. The recommended time to get the shot is during your 27th through 36th week of pregnancy, preferably during the earlier part of this time period. This replaces the original recommendation that pregnant women get the vaccine only if they had not previously received it. The [employer] of [employer]stetricians and Gynecologists and the [employer] support this recommendation.  You should get the whooping cough vaccine while pregnant to  pass protection to your baby frame support disabled and/or not supported in this browser  Learn why Leita decided to get the whooping cough vaccine in her 3rd trimester of pregnancy and how her baby girl was born with some protection against the disease. Also available on YouTube. After receiving the whooping cough vaccine, your body will create protective antibodies (proteins produced by the body to fight off diseases) and pass some of them to your baby before birth. These antibodies provide your baby some short-term protection against whooping cough in early life. These antibodies can also protect your baby from some of the more serious complications that come along with whooping cough. Your protective antibodies are at their highest about 2 weeks after getting the vaccine, but it takes time to pass them to your baby. So the preferred time to get the whooping cough vaccine is early in your third trimester. The amount of whooping cough antibodies in your body decreases over time. That is why CDC recommends you get a whooping cough vaccine during each pregnancy. Doing so allows each of your babies to get the greatest number of protective antibodies from you. This means each of your babies will get the best protection possible against this disease.  Getting the whooping cough vaccine while pregnant is better than getting the vaccine after you give birth Whooping cough vaccination during pregnancy is ideal so  your baby will have short-term protection as soon as he is born. This early protection is important because your baby will not start getting his whooping cough vaccines until he is 2 months old. These first few months of life are when your baby is at greatest risk for catching whooping cough. This is also when he's at greatest risk for having severe, potentially life-threating complications from the infection. To avoid that gap in protection, it is best to get a whooping cough vaccine during pregnancy.  You will then pass protection to your baby before he is born. To continue protecting your baby, he should get whooping cough vaccines starting at 2 months old. You may never have gotten the Tdap vaccine before and did not get it during this pregnancy. If so, you should make sure to get the vaccine immediately after you give birth, before leaving the hospital or birthing center. It will take about 2 weeks before your body develops protection (antibodies) in response to the vaccine. Once you have protection from the vaccine, you are less likely to give whooping cough to your newborn while caring for him. But remember, your baby will still be at risk for catching whooping cough from others. A recent study looked to see how effective Tdap was at preventing whooping cough in babies whose mothers got the vaccine while pregnant or in the hospital after giving birth. The study found that getting Tdap between 27 through 36 weeks of pregnancy is 85% more effective at preventing whooping cough in babies younger than 2 months old. Blood tests cannot tell if you need a whooping cough vaccine There are no blood tests that can tell you if you have enough antibodies in your body to protect yourself or your baby against whooping cough. Even if you have been sick with whooping cough in the past or previously received the vaccine, you still should get the vaccine during each pregnancy. Breastfeeding may pass some protective antibodies onto your baby By breastfeeding, you may pass some antibodies you have made in response to the vaccine to your baby. When you get a whooping cough vaccine during your pregnancy, you will have antibodies in your breast milk that you can share with your baby as soon as your milk comes in. However, your baby will not get protective antibodies immediately if you wait to get the whooping cough vaccine until after delivering your baby. This is because it takes about 2 weeks for your body to create  antibodies. Learn more about the health benefits of breastfeeding.    RSV Vaccination for Pregnant People  CDC recommends two ways to protect babies from getting very sick with Respiratory Syncytial Virus (RSV):  An RSV vaccination given during pregnancy  Pfizer's vaccine (Abrysvo) is recommended for use during pregnancy. It is given during RSV season to people who are 32 through [redacted] weeks pregnant.  Or, An RSV immunization given directly to infants and some older babies  Babies born to mothers who get RSV vaccine at least 2 weeks before delivery will have protection and, in most cases, should not need an RSV immunization later.    When is RSV season?  In most regions of the [place]  RSV season starts in the fall and peaks in the winter, but the timing and severity of RSV season can vary from place to place and year to year.   The goal of maternal RSV vaccination is to protect babies from getting very sick with RSV during their first RSV season.  In most of the continental [place] , this means maternal RSV vaccine will be given in September through January.  Who should get the maternal RSV vaccine?  People who are 64 through [redacted] weeks pregnant during September through January should get one dose of maternal RSV vaccine to protect their babies. RSV season can vary around the country.   How is the maternal RSV vaccine administered?  Maternal RSV vaccine is given as a shot into the mother's upper arm. Only a single dose (one shot) of maternal RSV vaccine is recommended.   It is not yet known whether another dose might be needed in later pregnancies.  How well does the maternal RSV vaccine work?  When someone gets RSV vaccine, their body responds by making a protein that protects against the virus that causes RSV. The process takes about 2 weeks. When a pregnant person gets RSV vaccine, their protective proteins (called antibodies) also pass to their baby. So, babies who are  born at least 2 weeks after their mother gets RSV vaccine are protected at birth, when infants are at the highest risk of severe RSV disease.   The vaccine can reduce a baby's risk of being hospitalized from RSV by 57% in the first six months after birth.  What are the possible side effects of the maternal RSV vaccine?  In the clinical trials, the side effects most often reported by pregnant people who received the maternal RSV vaccine were pain at the injection site, headache, muscle pain, and nausea.  Although not common, a dangerous high blood pressure condition called pre-eclampsia occurred in 1.8% of pregnant people who received the maternal RSV vaccine compared to 1.4% of pregnant people who received a placebo.  The clinical trials identified a small increase in the number of preterm births in vaccinated pregnant people. It is not clear if this is a true safety problem related to RSV vaccine or if this occurred for reasons unrelated to vaccination.  To reduce the potential risk of preterm birth and complications from RSV disease, FDA approved the maternal RSV vaccine for use during weeks 32 through 38 of pregnancy while additional studies are conducted.  FDA is requiring the manufacturer to do additional studies that will look more closely at the potential risk of preterm births and pregnancy-related high blood pressure issues in mothers, including pre-eclampsia.  Severe allergic reactions to vaccines are rare but can happen after any vaccine and can be life-threatening. If you see signs of a severe allergic reaction after vaccination (hives, swelling of the face and throat, difficulty breathing, a fast heartbeat, dizziness, or weakness), seek immediate medical care by calling 911.  As with any medicine or vaccine there is a very remote chance of the vaccine causing other serious injury or death after vaccination.  Adverse events following vaccination should be reported to the Vaccine Adverse  Event Reporting System (VAERS), even if it's not clear that the vaccine caused the adverse event. You or your doctor can report an adverse event to [facility] and FDA through VAERS. If you need further assistance reporting to VAERS, please email [email or web address] or call 9412523293.  If you have any questions about side effects from the maternal RSV vaccine, talk with your healthcare provider.  Do I need a prescription for a maternal RSV vaccine?  Until the vaccine available in the office, you will need a prescription to take to a local pharmacy that is providing the vaccine.   How do I pay for the maternal RSV vaccine?  Most private health insurance plans cover the maternal RSV vaccine, but there may be a cost to you depending on your plan.  Contact your insurer to find out.  Medicaid Beginning December 26, 2021, most people with coverage from [facility] and [employer] [facility]) will be guaranteed coverage of all vaccines recommended by the Advisory Committee on Immunization Practice at no cost to them.   Source: [facility] for Immunization and Respiratory Diseases

## 2023-03-29 NOTE — L&D Delivery Note (Signed)
 Obstetrical Delivery Note   Date of Delivery:   07/21/2023 Primary OB:   Center for [facility] Gestational Age/EDD: [redacted]w[redacted]d Reason for Admission: Early labor. Variable decelerations Antepartum complications: marginal cord insertion  Delivered By:   Raynell Caller, Marieta Shorten MD  Delivery Type:   vacuum, outlet  Delivery Details:   I was called to see patient due to patient getting close to delivery. She pushed well and the baby tolerated the pushing well until the very end when the decelerations got deeper; peds was called to delivery. With the last pushing, the baby stayed in the 70s-80s for approximately 1.5 minutes so the patient was examined and felt to be a candidate for an operative vaginal delivery: position was +4 and direct OA and EFW 3300gm and foley removed approximately 15 minutes prior. R/b of vacuum OVD d/w her and she was amenable to this. Kiwi applied and used per [employer] and with next push, she easily delivered the baby. The cord was cut and clamped immediately and handed to the awaiting peds team because baby wasn't vigorous. Anesthesia:    local and epidural Intrapartum complications: Moderate meconium GBS:    Negative Laceration:    1st degree and periurethral (all repaired with 3-0 vicryl) Episiotomy:    none Rectal exam:   deferred Placenta:    Delivered and expressed via active management. Intact: yes. To pathology: no.  Delayed Cord Clamping: no Estimated Blood Loss:   Baby:    Liveborn female, APGARs 1/8, weight 3450gm Arterial pH: 7.23 / CO2: 53  / Bicarb: 22.2 / A-B deficit: 5.8 Venous pH: 7.29 / CO2: 43  / Bicarb: 20.7 / A-B deficit: 5.7 Tyler Gallant. MD Attending Center for [facility])

## 2023-04-26 ENCOUNTER — Encounter: Payer: Medicaid Other | Admitting: Family Medicine

## 2023-04-27 ENCOUNTER — Encounter: Payer: Self-pay | Admitting: *Deleted

## 2023-04-27 DIAGNOSIS — O99330 Smoking (tobacco) complicating pregnancy, unspecified trimester: Secondary | ICD-10-CM | POA: Insufficient documentation

## 2023-04-27 DIAGNOSIS — Z720 Tobacco use: Secondary | ICD-10-CM | POA: Insufficient documentation

## 2023-04-28 ENCOUNTER — Ambulatory Visit (INDEPENDENT_AMBULATORY_CARE_PROVIDER_SITE_OTHER): Payer: Medicaid Other | Admitting: Family Medicine

## 2023-04-28 ENCOUNTER — Other Ambulatory Visit: Payer: Medicaid Other

## 2023-04-28 VITALS — BP 103/72 | HR 74 | Wt 163.0 lb

## 2023-04-28 DIAGNOSIS — O99333 Smoking (tobacco) complicating pregnancy, third trimester: Secondary | ICD-10-CM

## 2023-04-28 DIAGNOSIS — Z3480 Encounter for supervision of other normal pregnancy, unspecified trimester: Secondary | ICD-10-CM | POA: Diagnosis not present

## 2023-04-28 DIAGNOSIS — Z3A27 27 weeks gestation of pregnancy: Secondary | ICD-10-CM

## 2023-04-28 DIAGNOSIS — O99332 Smoking (tobacco) complicating pregnancy, second trimester: Secondary | ICD-10-CM

## 2023-04-28 DIAGNOSIS — O43192 Other malformation of placenta, second trimester: Secondary | ICD-10-CM

## 2023-04-28 DIAGNOSIS — F17210 Nicotine dependence, cigarettes, uncomplicated: Secondary | ICD-10-CM

## 2023-04-28 NOTE — Progress Notes (Signed)
ROB   2hr GTT today.  CC: visual changes notes seeing spots sometimes last episode was yesterday. HA's sometimes.

## 2023-04-28 NOTE — Progress Notes (Signed)
   PRENATAL VISIT NOTE  Subjective:  Suzanne Haynes is a 23 y.o. G1P0 at [redacted]w[redacted]d being seen today for ongoing prenatal care.  She is currently monitored for the following issues for this low-risk pregnancy and has Supervision of other normal pregnancy, antepartum; Marginal insertion of umbilical cord affecting management of mother in second trimester; and Tobacco use during pregnancy on their problem list.  Patient reports no complaints.  Contractions: Not present. Vag. Bleeding: None.  Movement: Present. Denies leaking of fluid.   The following portions of the patient's history were reviewed and updated as appropriate: allergies, current medications, past family history, past medical history, past social history, past surgical history and problem list.   Objective:   Vitals:   04/28/23 0837  BP: 103/72  Pulse: 74  Weight: 163 lb (73.9 kg)    Fetal Status: Fetal Heart Rate (bpm): 147 Fundal Height: 28 cm Movement: Present     General:  Alert, oriented and cooperative. Patient is in no acute distress.  Skin: Skin is warm and dry. No rash noted.   Cardiovascular: Normal heart rate noted  Respiratory: Normal respiratory effort, no problems with respiration noted  Abdomen: Soft, gravid, appropriate for gestational age.  Pain/Pressure: Absent     Pelvic: Cervical exam deferred        Extremities: Normal range of motion.  Edema: Trace  Mental Status: Normal mood and affect. Normal behavior. Normal judgment and thought content.   Assessment and Plan:  Pregnancy: G1P0 at [redacted]w[redacted]d 1. Supervision of other normal pregnancy, antepartum (Primary) Up to date Vigorous movement Fh appropriate Reports seeing sparkles sometimes, happens frequently but not currently happening. She does not associate with changes in position or prolonged standing. Drinks 4-5 plastic bottles of water. Recommended symptom journal and discussed I am reassured by her BP here and suspect it has to do with drops in BP. Reviewed  increasing water intake and adding some electrolytes.   2. Marginal insertion of umbilical cord affecting management of mother in second trimester Repeat growth [place] scheduled  3. Maternal tobacco use in third trimester Secondhand smoke exposure- mother and step father smoke inside and outside of the house.   Preterm labor symptoms and general obstetric precautions including but not limited to vaginal bleeding, contractions, leaking of fluid and fetal movement were reviewed in detail with the patient. Please refer to After Visit Summary for other counseling recommendations.   Return in about 2 weeks (around 05/12/2023) for Routine prenatal care.  Future Appointments  Date Time Provider Department Center  04/28/2023  8:55 AM Federico Flake, MD [facility]  05/05/2023  3:15 PM [facility] NURSE [facility]  05/05/2023  3:30 PM [facility] US3 [facility]  05/10/2023  1:30 PM Reva Bores, MD [facility]  05/24/2023  1:30 PM Reva Bores, MD [facility]    Federico Flake, MD

## 2023-04-29 LAB — CBC
Hematocrit: 34 % (ref 34.0–46.6)
Hemoglobin: 11 g/dL — ABNORMAL LOW (ref 11.1–15.9)
MCH: 30 pg (ref 26.6–33.0)
MCHC: 32.4 g/dL (ref 31.5–35.7)
MCV: 93 fL (ref 79–97)
Platelets: 260 10*3/uL (ref 150–450)
RBC: 3.67 x10E6/uL — ABNORMAL LOW (ref 3.77–5.28)
RDW: 12.4 % (ref 11.7–15.4)
WBC: 16.7 10*3/uL — ABNORMAL HIGH (ref 3.4–10.8)

## 2023-04-29 LAB — RPR: RPR Ser Ql: NONREACTIVE

## 2023-04-29 LAB — GLUCOSE TOLERANCE, 2 HOURS W/ 1HR
Glucose, 1 hour: 122 mg/dL (ref 70–179)
Glucose, 2 hour: 92 mg/dL (ref 70–152)
Glucose, Fasting: 75 mg/dL (ref 70–91)

## 2023-04-29 LAB — HIV ANTIBODY (ROUTINE TESTING W REFLEX): HIV Screen 4th Generation wRfx: NONREACTIVE

## 2023-05-03 ENCOUNTER — Encounter: Payer: Self-pay | Admitting: Family Medicine

## 2023-05-05 ENCOUNTER — Other Ambulatory Visit: Payer: Self-pay

## 2023-05-05 ENCOUNTER — Ambulatory Visit: Payer: Medicaid Other | Attending: Maternal & Fetal Medicine

## 2023-05-05 ENCOUNTER — Ambulatory Visit: Payer: Medicaid Other

## 2023-05-05 DIAGNOSIS — Z362 Encounter for other antenatal screening follow-up: Secondary | ICD-10-CM | POA: Diagnosis present

## 2023-05-05 DIAGNOSIS — O43193 Other malformation of placenta, third trimester: Secondary | ICD-10-CM | POA: Diagnosis not present

## 2023-05-05 DIAGNOSIS — Z3A28 28 weeks gestation of pregnancy: Secondary | ICD-10-CM | POA: Diagnosis not present

## 2023-05-08 ENCOUNTER — Other Ambulatory Visit: Payer: Self-pay | Admitting: *Deleted

## 2023-05-08 DIAGNOSIS — O43199 Other malformation of placenta, unspecified trimester: Secondary | ICD-10-CM

## 2023-05-08 DIAGNOSIS — O99333 Smoking (tobacco) complicating pregnancy, third trimester: Secondary | ICD-10-CM

## 2023-05-10 ENCOUNTER — Ambulatory Visit (INDEPENDENT_AMBULATORY_CARE_PROVIDER_SITE_OTHER): Payer: Medicaid Other | Admitting: Family Medicine

## 2023-05-10 VITALS — BP 115/77 | HR 71 | Wt 167.0 lb

## 2023-05-10 DIAGNOSIS — O43192 Other malformation of placenta, second trimester: Secondary | ICD-10-CM

## 2023-05-10 DIAGNOSIS — Z3A29 29 weeks gestation of pregnancy: Secondary | ICD-10-CM | POA: Diagnosis not present

## 2023-05-10 DIAGNOSIS — Z3480 Encounter for supervision of other normal pregnancy, unspecified trimester: Secondary | ICD-10-CM

## 2023-05-10 NOTE — Progress Notes (Signed)
    PRENATAL VISIT NOTE  Subjective:  Suzanne Haynes is a 23 y.o. G1P0 at [redacted]w[redacted]d being seen today for ongoing prenatal care.  She is currently monitored for the following issues for this low-risk pregnancy and has Supervision of other normal pregnancy, antepartum; Marginal insertion of umbilical cord affecting management of mother in second trimester; and Tobacco use during pregnancy on their problem list.  Patient reports no complaints.  Contractions: Not present. Vag. Bleeding: None.  Movement: Present. Denies leaking of fluid.   The following portions of the patient's history were reviewed and updated as appropriate: allergies, current medications, past family history, past medical history, past social history, past surgical history and problem list.   Objective:   Vitals:   05/10/23 1336  BP: 115/77  Pulse: 71  Weight: 167 lb (75.8 kg)    Fetal Status: Fetal Heart Rate (bpm): 146 Fundal Height: 29 cm Movement: Present     General:  Alert, oriented and cooperative. Patient is in no acute distress.  Skin: Skin is warm and dry. No rash noted.   Cardiovascular: Normal heart rate noted  Respiratory: Normal respiratory effort, no problems with respiration noted  Abdomen: Soft, gravid, appropriate for gestational age.  Pain/Pressure: Absent     Pelvic: Cervical exam deferred        Extremities: Normal range of motion.  Edema: None  Mental Status: Normal mood and affect. Normal behavior. Normal judgment and thought content.   Assessment and Plan:  Pregnancy: G1P0 at [redacted]w[redacted]d 1. Supervision of other normal pregnancy, antepartum (Primary) Continue routine prenatal care.  2. Marginal insertion of umbilical cord affecting management of mother in second trimester Normal growth @ 63%  3. [redacted] weeks gestation of pregnancy   Preterm labor symptoms and general obstetric precautions including but not limited to vaginal bleeding, contractions, leaking of fluid and fetal movement were reviewed in  detail with the patient. Please refer to After Visit Summary for other counseling recommendations.   Return in 1 week (on 05/17/2023).  Future Appointments  Date Time Provider Department Center  05/24/2023  1:30 PM Reva Bores, MD [facility]  06/07/2023  1:50 PM Travis Bing, MD [facility]  06/16/2023  3:15 PM [facility] NURSE [facility]  06/16/2023  3:30 PM [facility] US5 [facility]  06/21/2023  1:30 PM Reva Bores, MD [facility]  06/28/2023  1:30 PM Reva Bores, MD [facility]  07/05/2023  1:30 PM Reva Bores, MD [facility]  07/12/2023  1:50 PM Reva Bores, MD [facility]  07/19/2023  1:30 PM Reva Bores, MD [facility]    Reva Bores, MD

## 2023-05-10 NOTE — Patient Instructions (Signed)
[email or web address]

## 2023-05-10 NOTE — Progress Notes (Signed)
ROB  CC: None    Pt asked about tour of hospital.

## 2023-05-24 ENCOUNTER — Ambulatory Visit: Payer: Medicaid Other | Admitting: Family Medicine

## 2023-05-24 VITALS — BP 118/75 | HR 73 | Wt 170.0 lb

## 2023-05-24 DIAGNOSIS — Z3A31 31 weeks gestation of pregnancy: Secondary | ICD-10-CM | POA: Diagnosis not present

## 2023-05-24 DIAGNOSIS — O43192 Other malformation of placenta, second trimester: Secondary | ICD-10-CM

## 2023-05-24 DIAGNOSIS — Z3480 Encounter for supervision of other normal pregnancy, unspecified trimester: Secondary | ICD-10-CM

## 2023-05-24 NOTE — Patient Instructions (Addendum)
 [email or web address]

## 2023-05-24 NOTE — Progress Notes (Signed)
 ROB   Pt states she did not receive a link last visit for a virtual tour of the hospital.

## 2023-05-24 NOTE — Progress Notes (Signed)
   PRENATAL VISIT NOTE  Subjective:  Suzanne Haynes is a 23 y.o. G1P0 at [redacted]w[redacted]d being seen today for ongoing prenatal care.  She is currently monitored for the following issues for this low-risk pregnancy and has Supervision of other normal pregnancy, antepartum; Marginal insertion of umbilical cord affecting management of mother in second trimester; and Tobacco use during pregnancy on their problem list.  Patient reports no complaints.  Contractions: Not present. Vag. Bleeding: None.  Movement: Present. Denies leaking of fluid.   The following portions of the patient's history were reviewed and updated as appropriate: allergies, current medications, past family history, past medical history, past social history, past surgical history and problem list.   Objective:   Vitals:   05/24/23 1331  BP: 118/75  Pulse: 73  Weight: 170 lb (77.1 kg)    Fetal Status: Fetal Heart Rate (bpm): 146 Fundal Height: 31 cm Movement: Present     General:  Alert, oriented and cooperative. Patient is in no acute distress.  Skin: Skin is warm and dry. No rash noted.   Cardiovascular: Normal heart rate noted  Respiratory: Normal respiratory effort, no problems with respiration noted  Abdomen: Soft, gravid, appropriate for gestational age.  Pain/Pressure: Present     Pelvic: Cervical exam deferred        Extremities: Normal range of motion.  Edema: Mild pitting, slight indentation  Mental Status: Normal mood and affect. Normal behavior. Normal judgment and thought content.   Assessment and Plan:  Pregnancy: G1P0 at [redacted]w[redacted]d 1. Supervision of other normal pregnancy, antepartum (Primary) Continue routine prenatal care.  2. Marginal insertion of umbilical cord affecting management of mother in second trimester Normal growth @ 63%  3. [redacted] weeks gestation of pregnancy   Preterm labor symptoms and general obstetric precautions including but not limited to vaginal bleeding, contractions, leaking of fluid and  fetal movement were reviewed in detail with the patient. Please refer to After Visit Summary for other counseling recommendations.   Return in 2 weeks (on 06/07/2023).  Future Appointments  Date Time Provider Department Center  06/07/2023  1:50 PM [place] Bing, MD [facility]  06/16/2023  3:15 PM [facility] NURSE [facility]  06/16/2023  3:30 PM [facility] US5 [facility]  06/21/2023  1:30 PM Reva Bores, MD [facility]  06/28/2023  1:30 PM Reva Bores, MD [facility]  07/05/2023  1:30 PM Reva Bores, MD [facility]  07/12/2023  1:50 PM Reva Bores, MD [facility]  07/19/2023  1:30 PM Reva Bores, MD [facility]    Reva Bores, MD

## 2023-06-07 ENCOUNTER — Ambulatory Visit (INDEPENDENT_AMBULATORY_CARE_PROVIDER_SITE_OTHER): Payer: Medicaid Other | Admitting: Obstetrics and Gynecology

## 2023-06-07 VITALS — BP 114/72 | HR 84 | Wt 173.8 lb

## 2023-06-07 DIAGNOSIS — Z3A33 33 weeks gestation of pregnancy: Secondary | ICD-10-CM | POA: Diagnosis not present

## 2023-06-07 NOTE — Progress Notes (Signed)
   PRENATAL VISIT NOTE  Subjective:  Suzanne Haynes is a 23 y.o. G1P0 at [redacted]w[redacted]d being seen today for ongoing prenatal care.  She is currently monitored for the following issues for this low-risk pregnancy and has Supervision of other normal pregnancy, antepartum; Marginal insertion of umbilical cord affecting management of mother in second trimester; and Tobacco use during pregnancy on their problem list.  Patient reports no complaints.  Contractions: Not present. Vag. Bleeding: None.  Movement: Present. Denies leaking of fluid.   The following portions of the patient's history were reviewed and updated as appropriate: allergies, current medications, past family history, past medical history, past social history, past surgical history and problem list.   Objective:   Vitals:   06/07/23 1410  BP: 114/72  Pulse: 84  Weight: 173 lb 12.8 oz (78.8 kg)    Fetal Status: Fetal Heart Rate (bpm): 152   Movement: Present     General:  Alert, oriented and cooperative. Patient is in no acute distress.  Skin: Skin is warm and dry. No rash noted.   Cardiovascular: Normal heart rate noted  Respiratory: Normal respiratory effort, no problems with respiration noted  Abdomen: Soft, gravid, appropriate for gestational age.  Pain/Pressure: Present (when baby moves)     Pelvic: Cervical exam deferred        Extremities: Normal range of motion.  Edema: Trace  Mental Status: Normal mood and affect. Normal behavior. Normal judgment and thought content.   Assessment and Plan:  Pregnancy: G1P0 at [redacted]w[redacted]d 1. [redacted] weeks gestation of pregnancy (Primary) Routine care. Follow up surveillance growth with mfm already scheduled (marginal cord). Crib, peds practice anticipatory guidance d/w her.  -2/7: 63%, 1334g, ac 58%, afi wnl  Preterm labor symptoms and general obstetric precautions including but not limited to vaginal bleeding, contractions, leaking of fluid and fetal movement were reviewed in detail with the  patient. Please refer to After Visit Summary for other counseling recommendations.   Return in about 2 weeks (around 06/21/2023) for low risk ob, in person, md or app.  Future Appointments  Date Time Provider Department Center  06/16/2023  3:15 PM [facility] NURSE [facility]  06/16/2023  3:30 PM [facility] US5 [facility]  06/21/2023  1:30 PM Reva Bores, MD [facility]  06/28/2023  1:30 PM Reva Bores, MD [facility]  07/05/2023  1:30 PM Reva Bores, MD [facility]  07/12/2023  1:50 PM Reva Bores, MD [facility]  07/19/2023  1:30 PM Reva Bores, MD [facility]    [place] Bing, MD

## 2023-06-07 NOTE — Progress Notes (Signed)
 ROB: has some questions ?  Pack in play considered a safe place for newborns to sleep?    Wanting to know pre registration for hospital delivery.

## 2023-06-16 ENCOUNTER — Ambulatory Visit: Payer: Medicaid Other | Admitting: *Deleted

## 2023-06-16 ENCOUNTER — Ambulatory Visit: Payer: Medicaid Other | Attending: Obstetrics and Gynecology

## 2023-06-16 VITALS — BP 127/72 | HR 70

## 2023-06-16 DIAGNOSIS — O43193 Other malformation of placenta, third trimester: Secondary | ICD-10-CM

## 2023-06-16 DIAGNOSIS — O99333 Smoking (tobacco) complicating pregnancy, third trimester: Secondary | ICD-10-CM | POA: Diagnosis present

## 2023-06-16 DIAGNOSIS — Z3A34 34 weeks gestation of pregnancy: Secondary | ICD-10-CM

## 2023-06-16 DIAGNOSIS — O43192 Other malformation of placenta, second trimester: Secondary | ICD-10-CM | POA: Diagnosis present

## 2023-06-16 DIAGNOSIS — O43199 Other malformation of placenta, unspecified trimester: Secondary | ICD-10-CM

## 2023-06-20 ENCOUNTER — Encounter: Payer: Self-pay | Admitting: Obstetrics & Gynecology

## 2023-06-21 ENCOUNTER — Encounter: Payer: Self-pay | Admitting: Obstetrics & Gynecology

## 2023-06-21 ENCOUNTER — Ambulatory Visit: Payer: Medicaid Other | Admitting: Obstetrics & Gynecology

## 2023-06-21 VITALS — BP 121/75 | HR 79 | Wt 179.0 lb

## 2023-06-21 DIAGNOSIS — Z3A35 35 weeks gestation of pregnancy: Secondary | ICD-10-CM

## 2023-06-21 DIAGNOSIS — Z3480 Encounter for supervision of other normal pregnancy, unspecified trimester: Secondary | ICD-10-CM | POA: Diagnosis not present

## 2023-06-21 DIAGNOSIS — O43193 Other malformation of placenta, third trimester: Secondary | ICD-10-CM

## 2023-06-21 NOTE — Progress Notes (Signed)
 ROB   Pt request cervix check today due to losing partially some of mucous plug and increase in BH contractions.

## 2023-06-21 NOTE — Patient Instructions (Signed)

## 2023-06-21 NOTE — Progress Notes (Signed)
 PRENATAL VISIT NOTE  Subjective:  Suzanne Haynes is a 23 y.o. G1P0 at [redacted]w[redacted]d being seen today for ongoing prenatal care.  She is currently monitored for the following issues for this low-risk pregnancy and has Supervision of other normal pregnancy, antepartum; Marginal insertion of umbilical cord affecting management of mother in third trimester; and Tobacco use during pregnancy on their problem list.  Patient reports occasional contractions, lost mucus plug, wants cervical check.  Contractions: Irritability. Vag. Bleeding: None.  Movement: Present. Denies leaking of fluid.   The following portions of the patient's history were reviewed and updated as appropriate: allergies, current medications, past family history, past medical history, past social history, past surgical history and problem list.   Objective:   Vitals:   06/21/23 1322  BP: 121/75  Pulse: 79  Weight: 179 lb (81.2 kg)    Fetal Status: Fetal Heart Rate (bpm): 152   Movement: Present  Presentation: Vertex  General:  Alert, oriented and cooperative. Patient is in no acute distress.  Skin: Skin is warm and dry. No rash noted.   Cardiovascular: Normal heart rate noted  Respiratory: Normal respiratory effort, no problems with respiration noted  Abdomen: Soft, gravid, appropriate for gestational age.  Pain/Pressure: Present     Pelvic: Cervical exam performed in the presence of a chaperone Dilation: 1 Effacement (%): Thick Station: Ballotable  Extremities: Normal range of motion.  Edema: Moderate pitting, indentation subsides rapidly  Mental Status: Normal mood and affect. Normal behavior. Normal judgment and thought content.   [place] [facility] OB FOLLOW UP Result Date: 06/16/2023 ----------------------------------------------------------------------  OBSTETRICS REPORT                       (Signed Final 06/16/2023 03:50 pm) ---------------------------------------------------------------------- Patient Info  ID #:       [id number]                           D.O.B.:  May 14, 2000 (22 yrs)(F)  Name:       Suzanne Haynes                  Visit Date: 06/16/2023 03:32 pm ---------------------------------------------------------------------- Performed By  Attending:        Ma Rings MD         Ref. Address:     [address]                                                             [place], [place]                                                             [address]  Performed By:     Dennis Bast RDMS      Location[facility]  Referred By:      [place] Bing MD ---------------------------------------------------------------------- Orders  #  Description                           Code        Ordered By  1  [place] MFM OB FOLLOW UP                   16109.60    Noralee Space ----------------------------------------------------------------------  #  Order #                     Accession #                Episode #  1  [id number]                   [id number]                 [id number] ---------------------------------------------------------------------- Indications  Marginal insertion of umbilical cord affecting O43.193  management of mother in third trimester  Smoking complicating pregnancy, third          O99.333  trimester  Encounter for other antenatal screening        Z36.2  follow-up  [redacted] weeks gestation of pregnancy                Z3A.34  Low Risk NIPS ---------------------------------------------------------------------- Vital Signs  BP:          127/72 ---------------------------------------------------------------------- Fetal Evaluation  Num Of Fetuses:         1  Fetal Heart Rate(bpm):  134  Cardiac Activity:       Observed  Presentation:           Cephalic  Placenta:               Anterior  P. Cord Insertion:       Marginal insertion  Amniotic Fluid  AFI FV:      Within normal limits  AFI Sum(cm)     %Tile       Largest Pocket(cm)  15.15           54          6.18  RUQ(cm)       RLQ(cm)       LUQ(cm)        LLQ(cm)  5.28          6.18          0              3.69 ---------------------------------------------------------------------- Biometry  BPD:  90.8  mm     G. Age:  36w 6d         96  %    CI:        77.08   %    70 - 86                                                          FL/HC:      21.2   %    19.4 - 21.8  HC:      327.5  mm     G. Age:  37w 1d         82  %    HC/AC:      1.05        0.96 - 1.11  AC:       313   mm     G. Age:  35w 2d         77  %    FL/BPD:     76.4   %    71 - 87  FL:       69.4  mm     G. Age:  35w 4d         72  %    FL/AC:      22.2   %    20 - 24  Est. FW:    2746  gm      6 lb 1 oz     81  % ---------------------------------------------------------------------- OB History  Blood Type:   O+  Gravidity:    1 ---------------------------------------------------------------------- Gestational Age  LMP:           34w 3d        Date:  10/18/22                  EDD:   07/25/23  U/S Today:     36w 2d                                        EDD:   07/12/23  Best:          34w 3d     Det. By:  LMP  (10/18/22)          EDD:   07/25/23 ---------------------------------------------------------------------- Anatomy  Cranium:               Previously seen        Aortic Arch:            Previously seen  Cavum:                 Previously seen        Ductal Arch:            Previously seen  Ventricles:            Appears normal         Diaphragm:              Appears normal  Choroid Plexus:        Previously seen        Stomach:  Appears normal, left                                                                        sided  Cerebellum:            Previously seen        Abdomen:                Previously seen  Posterior Fossa:       Previously seen        Abdominal Wall:         Previously  seen  Face:                  Orbits and profile     Cord Vessels:           Previously seen                         previously seen  Lips:                  Previously seen        Kidneys:                Appear normal  Thoracic:              Previously seen        Bladder:                Appears normal  Heart:                 Appears normal         Spine:                  Previously seen                         (4CH, axis, and                         situs)  RVOT:                  Previously seen        Upper Extremities:      Previously seen  LVOT:                  Previously seen        Lower Extremities:      Previously seen ---------------------------------------------------------------------- Cervix Uterus Adnexa  Cervix  Not visualized (advanced GA >24wks)  Uterus  No abnormality visualized.  Right Ovary  Not visualized.  Left Ovary  Not visualized.  Cul De Sac  No free fluid seen.  Adnexa  No abnormality visualized ---------------------------------------------------------------------- Comments  This patient was seen for a follow up growth scan due to a  marginal placental cord insertion noted on her prior  ultrasound exams.  She denies any problems in her current  pregnancy and has screened negative for gestational  diabetes.  She was informed that the fetal growth and amniotic fluid  level appears appropriate for her gestational age.  As the fetal growth is within normal limits, no further exams  were scheduled in  our office. ----------------------------------------------------------------------                   Ma Rings, MD Electronically Signed Final Report   06/16/2023 03:50 pm ----------------------------------------------------------------------    Assessment and Plan:  Pregnancy: G1P0 at [redacted]w[redacted]d 1. Marginal insertion of umbilical cord affecting management of mother in third trimester (Primary) EFW 81% on 06/16/23, doing well.    2. [redacted] weeks gestation of pregnancy 3. Supervision of other normal  pregnancy, antepartum Assured that this cervical examination can be normal at this GA, but strict PTL precautions advised. Preterm labor symptoms and general obstetric precautions including but not limited to vaginal bleeding, contractions, leaking of fluid and fetal movement were reviewed in detail with the patient. Please refer to After Visit Summary for other counseling recommendations.   Return in about 1 week (around 06/28/2023) for Pelvic cultures, OFFICE OB VISIT (MD only).  Future Appointments  Date Time Provider Department Center  06/28/2023  1:30 PM Reva Bores, MD [facility]  07/05/2023 11:30 AM Reva Bores, MD [facility]  07/12/2023  1:50 PM Reva Bores, MD [facility]  07/19/2023 11:30 AM Reva Bores, MD [facility]    Jaynie Collins, MD

## 2023-06-22 DIAGNOSIS — O1203 Gestational edema, third trimester: Secondary | ICD-10-CM | POA: Diagnosis not present

## 2023-06-22 DIAGNOSIS — Z3A35 35 weeks gestation of pregnancy: Secondary | ICD-10-CM | POA: Diagnosis not present

## 2023-06-22 DIAGNOSIS — O4703 False labor before 37 completed weeks of gestation, third trimester: Secondary | ICD-10-CM | POA: Diagnosis not present

## 2023-06-22 DIAGNOSIS — R519 Headache, unspecified: Secondary | ICD-10-CM | POA: Diagnosis not present

## 2023-06-22 DIAGNOSIS — O26893 Other specified pregnancy related conditions, third trimester: Secondary | ICD-10-CM | POA: Diagnosis not present

## 2023-06-28 ENCOUNTER — Ambulatory Visit: Payer: Medicaid Other | Admitting: Family Medicine

## 2023-06-28 ENCOUNTER — Encounter: Payer: Self-pay | Admitting: Family Medicine

## 2023-06-28 ENCOUNTER — Other Ambulatory Visit (HOSPITAL_COMMUNITY)
Admission: RE | Admit: 2023-06-28 | Discharge: 2023-06-28 | Disposition: A | Discharge status: Home / Self Care | Source: Ambulatory Visit | Attending: Family Medicine | Admitting: Family Medicine

## 2023-06-28 VITALS — BP 119/76 | HR 98 | Wt 178.0 lb

## 2023-06-28 DIAGNOSIS — Z3A36 36 weeks gestation of pregnancy: Secondary | ICD-10-CM | POA: Diagnosis not present

## 2023-06-28 DIAGNOSIS — O43193 Other malformation of placenta, third trimester: Secondary | ICD-10-CM | POA: Diagnosis not present

## 2023-06-28 DIAGNOSIS — Z3480 Encounter for supervision of other normal pregnancy, unspecified trimester: Secondary | ICD-10-CM | POA: Diagnosis not present

## 2023-06-28 NOTE — Progress Notes (Signed)
 ROB   GBS today.  Pt went to [facility] last Thursday for irregular contractions/BH.   Notes doing ok today.

## 2023-06-28 NOTE — Progress Notes (Signed)
   PRENATAL VISIT NOTE  Subjective:  Suzanne Haynes is a 23 y.o. G1P0 at [redacted]w[redacted]d being seen today for ongoing prenatal care.  She is currently monitored for the following issues for this low-risk pregnancy and has Supervision of other normal pregnancy, antepartum; Marginal insertion of umbilical cord affecting management of mother in third trimester; and Tobacco use during pregnancy on their problem list.  Patient reports contractions since last week. Seen at [facility].    . Vag. Bleeding: None.  Movement: Present. Denies leaking of fluid.   The following portions of the patient's history were reviewed and updated as appropriate: allergies, current medications, past family history, past medical history, past social history, past surgical history and problem list.   Objective:   Vitals:   06/28/23 1336  BP: 119/76  Pulse: 98  Weight: 178 lb (80.7 kg)    Fetal Status: Fetal Heart Rate (bpm): 140 Fundal Height: 33 cm Movement: Present  Presentation: Vertex  General:  Alert, oriented and cooperative. Patient is in no acute distress.  Skin: Skin is warm and dry. No rash noted.   Cardiovascular: Normal heart rate noted  Respiratory: Normal respiratory effort, no problems with respiration noted  Abdomen: Soft, gravid, appropriate for gestational age.  Pain/Pressure: Present     Pelvic: Cervical exam performed in the presence of a chaperone Dilation: 1 Effacement (%): 40, 50 Station: -2  Extremities: Normal range of motion.  Edema: None  Mental Status: Normal mood and affect. Normal behavior. Normal judgment and thought content.   Assessment and Plan:  Pregnancy: G1P0 at [redacted]w[redacted]d 1. Supervision of other normal pregnancy, antepartum (Primary) Continue routine prenatal care. Cultures today - Strep Gp B NAA - Cervicovaginal ancillary only  2. Marginal insertion of umbilical cord affecting management of mother in third trimester Normal growth 81%  3. [redacted] weeks gestation of  pregnancy   Preterm labor symptoms and general obstetric precautions including but not limited to vaginal bleeding, contractions, leaking of fluid and fetal movement were reviewed in detail with the patient. Please refer to After Visit Summary for other counseling recommendations.   Return in about 1 week (around 07/05/2023) for [facility], virtual.  Future Appointments  Date Time Provider Department Center  07/05/2023 11:30 AM Reva Bores, MD [facility]  07/12/2023  1:50 PM Reva Bores, MD [facility]  07/19/2023 11:30 AM Reva Bores, MD [facility]    Reva Bores, MD

## 2023-06-29 LAB — CERVICOVAGINAL ANCILLARY ONLY
Chlamydia: NEGATIVE
Comment: NEGATIVE
Comment: NORMAL
Neisseria Gonorrhea: NEGATIVE

## 2023-06-30 LAB — STREP GP B NAA: Strep Gp B NAA: NEGATIVE

## 2023-07-05 ENCOUNTER — Ambulatory Visit (INDEPENDENT_AMBULATORY_CARE_PROVIDER_SITE_OTHER): Payer: Medicaid Other | Admitting: Family Medicine

## 2023-07-05 VITALS — BP 120/81 | HR 72 | Wt 182.0 lb

## 2023-07-05 DIAGNOSIS — Z3480 Encounter for supervision of other normal pregnancy, unspecified trimester: Secondary | ICD-10-CM

## 2023-07-05 DIAGNOSIS — O43193 Other malformation of placenta, third trimester: Secondary | ICD-10-CM | POA: Diagnosis not present

## 2023-07-05 DIAGNOSIS — Z3A37 37 weeks gestation of pregnancy: Secondary | ICD-10-CM

## 2023-07-05 NOTE — Progress Notes (Signed)
   PRENATAL VISIT NOTE  Subjective:  Suzanne Haynes is a 23 y.o. G1P0 at [redacted]w[redacted]d being seen today for ongoing prenatal care.  She is currently monitored for the following issues for this low-risk pregnancy and has Supervision of other normal pregnancy, antepartum; Marginal insertion of umbilical cord affecting management of mother in third trimester; and Tobacco use during pregnancy on their problem list.  Patient reports no complaints.  Contractions: Irritability. Vag. Bleeding: None.  Movement: Present. Denies leaking of fluid.   The following portions of the patient's history were reviewed and updated as appropriate: allergies, current medications, past family history, past medical history, past social history, past surgical history and problem list.   Objective:   Vitals:   07/05/23 1142  BP: 120/81  Pulse: 72  Weight: 182 lb (82.6 kg)    Fetal Status: Fetal Heart Rate (bpm): 132 Fundal Height: 33 cm Movement: Present  Presentation: Vertex  General:  Alert, oriented and cooperative. Patient is in no acute distress.  Skin: Skin is warm and dry. No rash noted.   Cardiovascular: Normal heart rate noted  Respiratory: Normal respiratory effort, no problems with respiration noted  Abdomen: Soft, gravid, appropriate for gestational age.  Pain/Pressure: Present     Pelvic: Cervical exam performed in the presence of a chaperone Dilation: 1 Effacement (%): 40 Station: -2  Extremities: Normal range of motion.  Edema: None  Mental Status: Normal mood and affect. Normal behavior. Normal judgment and thought content.   Assessment and Plan:  Pregnancy: G1P0 at [redacted]w[redacted]d 1. Supervision of other normal pregnancy, antepartum (Primary) Continue routine prenatal care.  2. Marginal insertion of umbilical cord affecting management of mother in third trimester Normal recent growth  3. [redacted] weeks gestation of pregnancy What to take to the hospital Preparing for labor  Term labor symptoms and general  obstetric precautions including but not limited to vaginal bleeding, contractions, leaking of fluid and fetal movement were reviewed in detail with the patient. Please refer to After Visit Summary for other counseling recommendations.   Return in 1 week (on 07/12/2023).  Future Appointments  Date Time Provider Department Center  07/12/2023  1:50 PM Reva Bores, MD [facility]  07/19/2023 11:30 AM Reva Bores, MD [facility]    Reva Bores, MD

## 2023-07-05 NOTE — Progress Notes (Signed)
 ROB   CC: Cramping, BH contractions, not able to sleep. Discharge that is now thin notes has been mucous like, watery before now thin.    Pt would like cervix check.

## 2023-07-12 ENCOUNTER — Ambulatory Visit: Payer: Medicaid Other | Admitting: Family Medicine

## 2023-07-12 VITALS — BP 116/76 | HR 69

## 2023-07-12 DIAGNOSIS — Z3480 Encounter for supervision of other normal pregnancy, unspecified trimester: Secondary | ICD-10-CM

## 2023-07-12 DIAGNOSIS — Z3A38 38 weeks gestation of pregnancy: Secondary | ICD-10-CM

## 2023-07-12 DIAGNOSIS — O43193 Other malformation of placenta, third trimester: Secondary | ICD-10-CM

## 2023-07-12 NOTE — Progress Notes (Signed)
   PRENATAL VISIT NOTE  Subjective:  Suzanne Haynes is a 23 y.o. G1P0 at [redacted]w[redacted]d being seen today for ongoing prenatal care.  She is currently monitored for the following issues for this low-risk pregnancy and has Supervision of other normal pregnancy, antepartum; Marginal insertion of umbilical cord affecting management of mother in third trimester; and Tobacco use during pregnancy on their problem list.  Patient reports no complaints.  Contractions: Not present. Vag. Bleeding: None.  Movement: Present. Denies leaking of fluid.   The following portions of the patient's history were reviewed and updated as appropriate: allergies, current medications, past family history, past medical history, past social history, past surgical history and problem list.   Objective:   Vitals:   07/12/23 1414  BP: 116/76  Pulse: 69    Fetal Status: Fetal Heart Rate (bpm): 138 Fundal Height: 36 cm Movement: Present  Presentation: Vertex  General:  Alert, oriented and cooperative. Patient is in no acute distress.  Skin: Skin is warm and dry. No rash noted.   Cardiovascular: Normal heart rate noted  Respiratory: Normal respiratory effort, no problems with respiration noted  Abdomen: Soft, gravid, appropriate for gestational age.  Pain/Pressure: Present     Pelvic: Cervical exam performed in the presence of a chaperone Dilation: 1 Effacement (%): 60, 70 Station: -2, -1  Extremities: Normal range of motion.  Edema: None  Mental Status: Normal mood and affect. Normal behavior. Normal judgment and thought content.   Assessment and Plan:  Pregnancy: G1P0 at [redacted]w[redacted]d 1. Supervision of other normal pregnancy, antepartum (Primary) Continue routine prenatal care.  2. Marginal insertion of umbilical cord affecting management of mother in third trimester Normal recent growth  3. [redacted] weeks gestation of pregnancy   Preterm labor symptoms and general obstetric precautions including but not limited to vaginal bleeding,  contractions, leaking of fluid and fetal movement were reviewed in detail with the patient. Please refer to After Visit Summary for other counseling recommendations.   Return in 1 week (on 07/19/2023).  Future Appointments  Date Time Provider Department Center  07/19/2023 11:30 AM Granville Layer, MD [facility]    Granville Layer, MD

## 2023-07-12 NOTE — Progress Notes (Signed)
 ROB   Pt asked about membrane sweep *Peds list/recommendations   CERVIX CHECK.

## 2023-07-19 ENCOUNTER — Ambulatory Visit: Payer: Medicaid Other | Admitting: Family Medicine

## 2023-07-19 ENCOUNTER — Telehealth (HOSPITAL_COMMUNITY): Payer: Self-pay | Admitting: *Deleted

## 2023-07-19 ENCOUNTER — Encounter (HOSPITAL_COMMUNITY): Payer: Self-pay

## 2023-07-19 ENCOUNTER — Encounter: Payer: Self-pay | Admitting: Family Medicine

## 2023-07-19 VITALS — BP 118/80 | HR 88 | Wt 184.0 lb

## 2023-07-19 DIAGNOSIS — Z3A39 39 weeks gestation of pregnancy: Secondary | ICD-10-CM | POA: Diagnosis not present

## 2023-07-19 DIAGNOSIS — O43193 Other malformation of placenta, third trimester: Secondary | ICD-10-CM | POA: Diagnosis not present

## 2023-07-19 DIAGNOSIS — Z3480 Encounter for supervision of other normal pregnancy, unspecified trimester: Secondary | ICD-10-CM

## 2023-07-19 NOTE — Progress Notes (Signed)
   PRENATAL VISIT NOTE  Subjective:  Suzanne Haynes is a 23 y.o. G1P0 at [redacted]w[redacted]d being seen today for ongoing prenatal care.  She is currently monitored for the following issues for this low-risk pregnancy and has Supervision of other normal pregnancy, antepartum; Marginal insertion of umbilical cord affecting management of mother in third trimester; and Tobacco use during pregnancy on their problem list.  Patient reports no complaints.  Contractions: Irritability. Vag. Bleeding: None.  Movement: Present. Denies leaking of fluid.   The following portions of the patient's history were reviewed and updated as appropriate: allergies, current medications, past family history, past medical history, past social history, past surgical history and problem list.   Objective:   Vitals:   07/19/23 1133  BP: 118/80  Pulse: 88  Weight: 184 lb (83.5 kg)    Fetal Status: Fetal Heart Rate (bpm): 158 Fundal Height: 35 cm Movement: Present  Presentation: Vertex  General:  Alert, oriented and cooperative. Patient is in no acute distress.  Skin: Skin is warm and dry. No rash noted.   Cardiovascular: Normal heart rate noted  Respiratory: Normal respiratory effort, no problems with respiration noted  Abdomen: Soft, gravid, appropriate for gestational age.  Pain/Pressure: Present     Pelvic: Cervical exam performed in the presence of a chaperone Dilation: 2 Effacement (%): 70 Station: -2, -1  Extremities: Normal range of motion.  Edema: Moderate pitting, indentation subsides rapidly  Mental Status: Normal mood and affect. Normal behavior. Normal judgment and thought content.   Assessment and Plan:  Pregnancy: G1P0 at [redacted]w[redacted]d 1. Supervision of other normal pregnancy, antepartum (Primary) IOL scheduled @ 41 weeks  2. Marginal insertion of umbilical cord affecting management of mother in third trimester Normal recent growth 81%  3. [redacted] weeks gestation of pregnancy Membranes stripped today  Preterm labor  symptoms and general obstetric precautions including but not limited to vaginal bleeding, contractions, leaking of fluid and fetal movement were reviewed in detail with the patient. Please refer to After Visit Summary for other counseling recommendations.   Return in 1 week (on 07/26/2023).  Future Appointments  Date Time Provider Department Center  07/26/2023  2:30 PM Granville Layer, MD [facility]    Granville Layer, MD

## 2023-07-19 NOTE — Telephone Encounter (Signed)
 Preadmission screen

## 2023-07-19 NOTE — Progress Notes (Signed)
 ROB   CC: swelling in feet, lighting crotch sensations, BH ctx's.  Pt wants cervix check

## 2023-07-20 ENCOUNTER — Inpatient Hospital Stay (HOSPITAL_COMMUNITY)
Admission: AD | Admit: 2023-07-20 | Discharge: 2023-07-23 | DRG: 807 | Disposition: A | Discharge status: Home / Self Care | Attending: Obstetrics and Gynecology | Admitting: Obstetrics and Gynecology

## 2023-07-20 ENCOUNTER — Telehealth (HOSPITAL_COMMUNITY): Payer: Self-pay | Admitting: *Deleted

## 2023-07-20 ENCOUNTER — Encounter (HOSPITAL_COMMUNITY): Payer: Self-pay | Admitting: *Deleted

## 2023-07-20 ENCOUNTER — Encounter (HOSPITAL_COMMUNITY): Payer: Self-pay | Admitting: Obstetrics and Gynecology

## 2023-07-20 DIAGNOSIS — O43193 Other malformation of placenta, third trimester: Secondary | ICD-10-CM | POA: Diagnosis present

## 2023-07-20 DIAGNOSIS — Z560 Unemployment, unspecified: Secondary | ICD-10-CM

## 2023-07-20 DIAGNOSIS — O99334 Smoking (tobacco) complicating childbirth: Secondary | ICD-10-CM | POA: Diagnosis not present

## 2023-07-20 DIAGNOSIS — Z3480 Encounter for supervision of other normal pregnancy, unspecified trimester: Principal | ICD-10-CM

## 2023-07-20 DIAGNOSIS — O480 Post-term pregnancy: Secondary | ICD-10-CM | POA: Diagnosis present

## 2023-07-20 DIAGNOSIS — Z3A39 39 weeks gestation of pregnancy: Secondary | ICD-10-CM

## 2023-07-20 DIAGNOSIS — O4423 Partial placenta previa NOS or without hemorrhage, third trimester: Secondary | ICD-10-CM | POA: Diagnosis not present

## 2023-07-20 HISTORY — DX: Anxiety disorder, unspecified: F41.9

## 2023-07-20 LAB — CBC
HCT: 33 % — ABNORMAL LOW (ref 36.0–46.0)
Hemoglobin: 10.9 g/dL — ABNORMAL LOW (ref 12.0–15.0)
MCH: 28.8 pg (ref 26.0–34.0)
MCHC: 33 g/dL (ref 30.0–36.0)
MCV: 87.1 fL (ref 80.0–100.0)
Platelets: 293 10*3/uL (ref 150–400)
RBC: 3.79 MIL/uL — ABNORMAL LOW (ref 3.87–5.11)
RDW: 13.4 % (ref 11.5–15.5)
WBC: 30.5 10*3/uL — ABNORMAL HIGH (ref 4.0–10.5)
nRBC: 0 % (ref 0.0–0.2)

## 2023-07-20 LAB — TYPE AND SCREEN
ABO/RH(D): O POS
Antibody Screen: NEGATIVE

## 2023-07-20 MED ORDER — ONDANSETRON HCL 4 MG/2ML IJ SOLN: 4.0000 mg | Freq: Four times a day (QID) | INTRAMUSCULAR | Status: DC | PRN

## 2023-07-20 MED ORDER — TERBUTALINE SULFATE 1 MG/ML IJ SOLN: 0.2500 mg | Freq: Once | INTRAMUSCULAR | Status: DC | PRN

## 2023-07-20 MED ORDER — FLEET ENEMA RE ENEM: 1.0000 | ENEMA | RECTAL | Status: DC | PRN

## 2023-07-20 MED ORDER — LACTATED RINGERS IV SOLN: 500.0000 mL | INTRAVENOUS | Status: DC | PRN

## 2023-07-20 MED ORDER — LACTATED RINGERS IV SOLN: INTRAVENOUS | Status: DC

## 2023-07-20 MED ORDER — ACETAMINOPHEN 325 MG PO TABS
650.0000 mg | ORAL_TABLET | ORAL | Status: DC | PRN
  Administered 2023-07-21: 650 mg via ORAL
  Filled 2023-07-20: qty 2

## 2023-07-20 MED ORDER — LIDOCAINE HCL (PF) 1 % IJ SOLN
30.0000 mL | INTRAMUSCULAR | Status: AC | PRN
  Administered 2023-07-21: 30 mL via SUBCUTANEOUS
  Filled 2023-07-20: qty 30

## 2023-07-20 MED ORDER — OXYTOCIN BOLUS FROM INFUSION
333.0000 mL | Freq: Once | INTRAVENOUS | Status: AC
  Administered 2023-07-21: 333 mL via INTRAVENOUS

## 2023-07-20 MED ORDER — SOD CITRATE-CITRIC ACID 500-334 MG/5ML PO SOLN: 30.0000 mL | ORAL | Status: DC | PRN

## 2023-07-20 MED ORDER — MISOPROSTOL 25 MCG QUARTER TABLET: 25.0000 ug | ORAL_TABLET | Freq: Once | ORAL | Status: DC

## 2023-07-20 MED ORDER — OXYTOCIN-SODIUM CHLORIDE 30-0.9 UT/500ML-% IV SOLN
2.5000 [IU]/h | INTRAVENOUS | Status: DC
  Administered 2023-07-21: 2.5 [IU]/h via INTRAVENOUS
  Filled 2023-07-20: qty 500

## 2023-07-20 MED ORDER — MISOPROSTOL 50MCG HALF TABLET: 50.0000 ug | ORAL_TABLET | Freq: Once | ORAL | Status: DC

## 2023-07-20 MED ORDER — FENTANYL CITRATE (PF) 100 MCG/2ML IJ SOLN: 50.0000 ug | INTRAMUSCULAR | Status: DC | PRN

## 2023-07-20 NOTE — MAU Note (Signed)
.  Suzanne Haynes is a 23 y.o. at [redacted]w[redacted]d here in MAU reporting ctxs since mid-day which stopped. Started back and regular for an hour. Was 2cm last wk.  Denies VB or LOF. Reports good FM.   LMP: n/a Onset of complaint: lunch time Pain score: 6 Vitals:   07/20/23 2006 07/20/23 2009  BP:  121/78  Pulse: (!) 105   Resp: 18   Temp: 98.2 F (36.8 C)   SpO2: 100%      FHT: 145  Lab orders placed from triage: labor eval

## 2023-07-20 NOTE — H&P (Signed)
 OBSTETRIC ADMISSION HISTORY AND PHYSICAL  Suzanne Haynes is a 23 y.o. female G1P0 with IUP at [redacted]w[redacted]d by LMP presenting for SOL with variable decelerations on FHT. She reports +FMs, No LOF, no VB, no blurry vision, headaches or peripheral edema, and RUQ pain.  She plans on breast feeding. She request POPs for birth control. She received her prenatal care at  [facility]    Dating: By LMP --->  Estimated Date of Delivery: 07/25/23  Sono:    @[redacted]w[redacted]d , CWD, normal anatomy, cephalic presentation, anterior placental lie, 2746g, 81% EFW   Prenatal History/Complications:  Patient Active Problem List   Diagnosis Date Noted   Post term pregnancy over 40 weeks 07/20/2023   Tobacco use during pregnancy 04/27/2023   Marginal insertion of umbilical cord affecting management of mother in third trimester 03/28/2023   Supervision of other normal pregnancy, antepartum 12/30/2022   Clinical Staff Provider  Office Location  [facility] Dating  L AND 10WK US   Language  English Anatomy US   [ ]  f/u scan to assess throat  Flu Vaccine  Declined 04/28/23 Genetic Screen  NIPS: neg  TDaP vaccine   04/28/23 Hgb A1C or  GTT Early : Third trimester : 72/122/92  Covid declined   LAB RESULTS   Rhogam  O/Positive/-- (10/16 1027)  Blood Type O/Positive/-- (10/16 1027)   RSV offer Antibody Negative (10/16 1027)  Feeding Plan breast Rubella 3.60 (10/16 1027)  Contraception pill RPR Non Reactive (01/31 0923)   Circumcision yes HBsAg Negative (10/16 1027)   Pediatrician  undecided HIV Non Reactive (01/31 0923)  Support Person Kathern Panda Varicella Non Reactive (10/16 1027)  Prenatal Classes undecided GBS  Negative      Hep C Non Reactive (10/16 1027)   BTL Consent   Pap No results found for: "DIAGPAP"  VBAC Consent   Hgb Electro         CF        SMA                Past Medical History: Past Medical History:  Diagnosis Date   Anxiety    Medical history non-contributory     Past Surgical History: Past  Surgical History:  Procedure Laterality Date   FOOT SURGERY Right    growth shaved down or taken out; 3d Grade   TONSILLECTOMY     age [age]    Obstetrical History: OB History     Gravida  1   Para      Term      Preterm      AB      Living         SAB      IAB      Ectopic      Multiple      Live Births              Social History Social History   Socioeconomic History   Marital status: Single    Spouse name: Not on file   Number of children: 0   Years of education: 11   Highest education level: Not on file  Occupational History   Occupation: unemployed  Tobacco Use   Smoking status: Never   Smokeless tobacco: Never   Tobacco comments:    mom and stepdad smoke inside and out of house  Vaping Use   Vaping status: Every Day  Substance and Sexual Activity   Alcohol use: Not Currently   Drug use: Never   Sexual  activity: Yes    Partners: Male    Birth control/protection: None  Other Topics Concern   Not on file  Social History Narrative   ** Merged History Encounter **       Social Drivers of Health   Financial Resource Strain: Low Risk  (12/30/2022)   Overall Financial Resource Strain (CARDIA)    Difficulty of Paying Living Expenses: Not very hard  Food Insecurity: Food Insecurity Present (12/30/2022)   Hunger Vital Sign    Worried About Running Out of Food in the Last Year: Sometimes true    Ran Out of Food in the Last Year: Sometimes true  Transportation Needs: No Transportation Needs (12/30/2022)   PRAPARE - Administrator, Civil Service (Medical): No    Lack of Transportation (Non-Medical): No  Physical Activity: Inactive (12/30/2022)   Exercise Vital Sign    Days of Exercise per Week: 0 days    Minutes of Exercise per Session: 0 min  Stress: No Stress Concern Present (12/30/2022)   [employer] of Occupational Health - Occupational Stress Questionnaire    Feeling of Stress : Not at all  Social Connections:  Moderately Isolated (12/30/2022)   Social Connection and Isolation Panel [NHANES]    Frequency of Communication with Friends and Family: More than three times a week    Frequency of Social Gatherings with Friends and Family: Three times a week    Attends Religious Services: Never    Active Member of Clubs or Organizations: No    Attends Banker Meetings: Never    Marital Status: Living with partner    Family History: Family History  Problem Relation Age of Onset   Depression Mother    Anxiety disorder Mother    Scoliosis Mother    Depression Father    Drug abuse Father    Alcoholism Father    ODD Sister    ADD / ADHD Sister    Paranoid behavior Brother    Schizophrenia Brother    ADD / ADHD Brother    ODD Brother    Healthy Maternal Grandmother    Healthy Maternal Grandfather    Healthy Paternal Grandmother    Healthy Paternal Grandfather     Allergies: No Known Allergies  Medications Prior to Admission  Medication Sig Dispense Refill Last Dose/Taking   Prenatal Vit-Fe Fumarate-FA (MULTIVITAMIN-PRENATAL) 27-0.8 MG TABS tablet Take 1 tablet by mouth daily at 12 noon. 90 tablet 4 07/19/2023   fluticasone  (FLONASE ) 50 MCG/ACT nasal spray Inhale one spray into each nostril once daily for allergy symptom control; rinse mouth and spit after use (Patient not taking: Reported on 05/24/2023) 16 g 12      Review of Systems   All systems reviewed and negative except as stated in HPI  Blood pressure 121/78, pulse (!) 105, temperature 98.2 F (36.8 C), resp. rate 18, height 5\' 6"  (1.676 m), weight 82.6 kg, last menstrual period 10/18/2022, SpO2 100%. General appearance: alert, cooperative, and appears stated age Lungs: clear to auscultation bilaterally Heart: regular rate and rhythm Abdomen: soft, non-tender; bowel sounds normal Pelvic: normal female genitalia  Extremities: Homans sign is negative, no sign of DVT Presentation: {desc; fetal presentation:14558} Fetal  monitoringBaseline: 125 bpm, Variability: Good {> 6 bpm), Accelerations: Reactive, and Decelerations: Variable: mild Uterine activity q2-67mins Dilation: 2.5 Effacement (%): 70 Station: -2 Exam by:: [employer], RN   Prenatal labs: ABO, Rh: O/Positive/-- (10/16 1027) Antibody: Negative (10/16 1027) Rubella: 3.60 (10/16 1027) RPR: Non Reactive (01/31  1610)  HBsAg: Negative (10/16 1027)  HIV: Non Reactive (01/31 0923)  GBS: Negative/-- (04/02 1401)    Lab Results  Component Value Date   GBS Negative 06/28/2023   GTT nrl Genetic screening  NIPS LR Anatomy US  ?prominent pharyngeal pouch resolved   Immunization History  Administered Date(s) Administered   DTaP 03/24/2003, 05/21/2003, 07/23/2003, 05/19/2004, 01/29/2007   HIB (PRP-OMP) 03/24/2003, 05/21/2003, 07/23/2003, 05/19/2004   HPV 9-valent 10/26/2015, 11/15/2017   Hepatitis A 01/12/2005, 12/06/2006   Hepatitis B 2003-02-14, 02/17/2003, 10/20/2003   IPV 03/24/2003, 05/21/2003, 10/20/2003, 01/29/2007   Influenza,Quad,Nasal, Live 01/07/2014   Influenza-Unspecified 01/30/2008, 02/02/2009, 02/11/2010   MMR 03/15/2004, 01/29/2007   Meningococcal Conjugate 10/26/2015   Pneumococcal Conjugate-13 03/24/2003, 05/21/2003, 07/23/2003, 05/19/2004   Tdap 06/25/2014   Varicella 03/15/2004, 01/29/2007    Prenatal Transfer Tool  Maternal Diabetes: No Genetic Screening: Normal Maternal Ultrasounds/Referrals: Other: prominent pharyngeal pouch Fetal Ultrasounds or other Referrals:  Referred to Materal Fetal Medicine  Maternal Substance Abuse:  No Significant Maternal Medications:  None Significant Maternal Lab Results: Group B Strep negative Number of Prenatal Visits:greater than 3 verified prenatal visits Maternal Vaccinations:TDap Other Comments:  None   No results found for this or any previous visit (from the past 24 hours).  Patient Active Problem List   Diagnosis Date Noted   Post term pregnancy over 40 weeks  07/20/2023   Tobacco use during pregnancy 04/27/2023   Marginal insertion of umbilical cord affecting management of mother in third trimester 03/28/2023   Supervision of other normal pregnancy, antepartum 12/30/2022    Assessment/Plan:  Veta Dambrosia is a 23 y.o. G1P0 at [redacted]w[redacted]d here for SOL and variable decels on NST.   #Labor: expectant management vs AROM / Pit #Pain: Per patient request #FWB: Cat 2 #GBS status:  negative #Feeding: Breastmilk  #Reproductive Life planning: Progesterone only pills #Circ:  yes  #Marginal cord insertion: - caution with delivery of placenta PP  #Tobacco use during pregnancy: ***  Ebony Goldstein, MD  07/20/2023, 9:36 PM

## 2023-07-20 NOTE — Telephone Encounter (Signed)
 Preadmission screen

## 2023-07-21 ENCOUNTER — Inpatient Hospital Stay (HOSPITAL_COMMUNITY): Admitting: Anesthesiology

## 2023-07-21 ENCOUNTER — Other Ambulatory Visit: Payer: Self-pay

## 2023-07-21 ENCOUNTER — Encounter (HOSPITAL_COMMUNITY): Payer: Self-pay | Admitting: Family Medicine

## 2023-07-21 DIAGNOSIS — O99334 Smoking (tobacco) complicating childbirth: Secondary | ICD-10-CM | POA: Diagnosis not present

## 2023-07-21 DIAGNOSIS — Z3A39 39 weeks gestation of pregnancy: Secondary | ICD-10-CM | POA: Diagnosis not present

## 2023-07-21 DIAGNOSIS — O4423 Partial placenta previa NOS or without hemorrhage, third trimester: Secondary | ICD-10-CM | POA: Diagnosis not present

## 2023-07-21 LAB — RPR: RPR Ser Ql: NONREACTIVE

## 2023-07-21 MED ORDER — BENZOCAINE-MENTHOL 20-0.5 % EX AERO
1.0000 | INHALATION_SPRAY | CUTANEOUS | Status: DC | PRN
  Filled 2023-07-21: qty 56

## 2023-07-21 MED ORDER — TETANUS-DIPHTH-ACELL PERTUSSIS 5-2.5-18.5 LF-MCG/0.5 IM SUSY: 0.5000 mL | PREFILLED_SYRINGE | Freq: Once | INTRAMUSCULAR | Status: DC

## 2023-07-21 MED ORDER — ONDANSETRON HCL 4 MG PO TABS: 4.0000 mg | ORAL_TABLET | ORAL | Status: DC | PRN

## 2023-07-21 MED ORDER — PHENYLEPHRINE 80 MCG/ML (10ML) SYRINGE FOR IV PUSH (FOR BLOOD PRESSURE SUPPORT): 80.0000 ug | PREFILLED_SYRINGE | INTRAVENOUS | Status: DC | PRN

## 2023-07-21 MED ORDER — OXYCODONE HCL 5 MG PO TABS
5.0000 mg | ORAL_TABLET | Freq: Four times a day (QID) | ORAL | Status: DC | PRN
  Filled 2023-07-21: qty 1

## 2023-07-21 MED ORDER — OXYTOCIN-SODIUM CHLORIDE 30-0.9 UT/500ML-% IV SOLN: 2.5000 [IU]/h | INTRAVENOUS | Status: DC | PRN

## 2023-07-21 MED ORDER — IBUPROFEN 600 MG PO TABS
600.0000 mg | ORAL_TABLET | Freq: Four times a day (QID) | ORAL | Status: DC
  Administered 2023-07-21 – 2023-07-23 (×7): 600 mg via ORAL
  Filled 2023-07-21 (×8): qty 1

## 2023-07-21 MED ORDER — COCONUT OIL OIL: 1.0000 | TOPICAL_OIL | Status: DC | PRN

## 2023-07-21 MED ORDER — EPHEDRINE 5 MG/ML INJ: 10.0000 mg | INTRAVENOUS | Status: DC | PRN

## 2023-07-21 MED ORDER — ACETAMINOPHEN 325 MG PO TABS
650.0000 mg | ORAL_TABLET | ORAL | Status: DC | PRN
  Administered 2023-07-22: 650 mg via ORAL
  Filled 2023-07-21: qty 2

## 2023-07-21 MED ORDER — LACTATED RINGERS IV SOLN: 500.0000 mL | Freq: Once | INTRAVENOUS | Status: DC

## 2023-07-21 MED ORDER — DIPHENHYDRAMINE HCL 50 MG/ML IJ SOLN: 12.5000 mg | INTRAMUSCULAR | Status: DC | PRN

## 2023-07-21 MED ORDER — ONDANSETRON HCL 4 MG/2ML IJ SOLN: 4.0000 mg | INTRAMUSCULAR | Status: DC | PRN

## 2023-07-21 MED ORDER — LIDOCAINE HCL (PF) 1 % IJ SOLN
INTRAMUSCULAR | Status: DC | PRN
  Administered 2023-07-21: 8 mL via EPIDURAL

## 2023-07-21 MED ORDER — WITCH HAZEL-GLYCERIN EX PADS: 1.0000 | MEDICATED_PAD | CUTANEOUS | Status: DC | PRN

## 2023-07-21 MED ORDER — DIBUCAINE (PERIANAL) 1 % EX OINT: 1.0000 | TOPICAL_OINTMENT | CUTANEOUS | Status: DC | PRN

## 2023-07-21 MED ORDER — PRENATAL MULTIVITAMIN CH
1.0000 | ORAL_TABLET | Freq: Every day | ORAL | Status: DC
  Administered 2023-07-22 – 2023-07-23 (×2): 1 via ORAL
  Filled 2023-07-21 (×2): qty 1

## 2023-07-21 MED ORDER — SENNOSIDES-DOCUSATE SODIUM 8.6-50 MG PO TABS: 2.0000 | ORAL_TABLET | Freq: Every evening | ORAL | Status: DC | PRN

## 2023-07-21 MED ORDER — PHENYLEPHRINE 80 MCG/ML (10ML) SYRINGE FOR IV PUSH (FOR BLOOD PRESSURE SUPPORT)
80.0000 ug | PREFILLED_SYRINGE | INTRAVENOUS | Status: DC | PRN
  Filled 2023-07-21: qty 10

## 2023-07-21 MED ORDER — FENTANYL-BUPIVACAINE-NACL 0.5-0.125-0.9 MG/250ML-% EP SOLN
12.0000 mL/h | EPIDURAL | Status: DC | PRN
  Administered 2023-07-21: 12 mL/h via EPIDURAL
  Filled 2023-07-21: qty 250

## 2023-07-21 MED ORDER — LACTATED RINGERS IV SOLN
500.0000 mL | Freq: Once | INTRAVENOUS | Status: AC
  Administered 2023-07-21: 500 mL via INTRAVENOUS

## 2023-07-21 MED ORDER — FENTANYL-BUPIVACAINE-NACL 0.5-0.125-0.9 MG/250ML-% EP SOLN
12.0000 mL/h | EPIDURAL | Status: DC | PRN
Start: 2023-07-21 — End: 2023-07-21

## 2023-07-21 MED ORDER — DIPHENHYDRAMINE HCL 25 MG PO CAPS: 25.0000 mg | ORAL_CAPSULE | Freq: Four times a day (QID) | ORAL | Status: DC | PRN

## 2023-07-21 MED ORDER — SIMETHICONE 80 MG PO CHEW: 80.0000 mg | CHEWABLE_TABLET | ORAL | Status: DC | PRN

## 2023-07-21 NOTE — Progress Notes (Signed)
 LABOR PROGRESS NOTE  Patient Name: Suzanne Haynes, female   DOB: [birth year], 23 y.o.  MRN: [id number]  Pt with regular contractions, cat 1 strip, agreeable to check.  Pt 4/80/-2.  Pt would like epidural prior to AROM.  AROM once comfortable following epidural.   Ebony Goldstein, MD

## 2023-07-21 NOTE — Lactation Note (Signed)
 This note was copied from a baby's chart.  NICU Lactation Consultation Note  Patient Name: Suzanne Haynes [id number] Date: 07/21/2023 Age:21 hours  Reason for consult: Initial assessment; Primapara; 1st time breastfeeding; NICU baby; Term  SUBJECTIVE  LC in to visit with P1 Mom of baby "[name]" in the NICU for respiratory support.  Baby is now on room air.    Mom was using a hand pump when LC entered the room.  Mom expressed colostrum into bottle.  LC set up the DEBP and provided a hands free pumping band and size 21 flanges. LC assisted with initiation setting after demonstrating breast massage and hand expression.  Mom desires to direct breast feed baby when he is able to feed.  Mom will be heading to NICU once her legs are less numb.  Encouraged a wheelchair when baby is allowed to eat so she can attempt breastfeeding.  To ask for LC prn.  Encouraged reclining in chair with baby prone on her chest.  OBJECTIVE Infant data: No data recorded O2 Device: Room Air FiO2 (%): 21 %  Infant feeding assessment No data recorded  Maternal data: G1P1001 Vaginal, Vacuum (Extractor) Has patient been taught Hand Expression?: Yes Hand Expression Comments: easy flow of colostrum Significant Breast History:: ++ breast changes Current breast feeding challenges:: infant admitted to NICU for respiratory support Does the patient have breastfeeding experience prior to this delivery?: No Pumping frequency: Mom initiated pumping at 4 hrs post delivery, 5 hrs with DEBP Pumped volume: 5 mL Flange Size: 21 Hands-free pumping top sizes: Small/Medium (Blue) Risk factor for low/delayed milk supply:: infant separation  Pump: Hands Free, Personal, Referral sent for [employer] Pump [employer])  ASSESSMENT Infant:  No data recorded Maternal: Milk volume: Normal  INTERVENTIONS/PLAN Interventions: Interventions: Breast feeding basics reviewed; Skin to skin; Breast massage; Hand express; Hand pump; DEBP;  Education; [employer] brochure; CDC Guidelines for Breast Pump Cleaning Tools: Pump; Flanges; Hands-free pumping top Pump Education: Setup, frequency, and cleaning; Milk Storage  Plan: 1- STS with baby as much as possible, watching baby for feeding cues 2- Breast massage and hand expression 3- Pump both breasts 15 mins every 2-3 hrs 4- ask for assistance with latching baby to the breast prn  Consult Status: NICU follow-up NICU Follow-up type: New admission follow up   Dario Edison 07/21/2023, 2:20 PM

## 2023-07-21 NOTE — Anesthesia Procedure Notes (Signed)
 Epidural Patient location during procedure: OB Start time: 07/21/2023 3:47 AM End time: 07/21/2023 3:50 AM  Staffing Anesthesiologist: Rhenda Cedars, MD  Preanesthetic Checklist Completed: patient identified, IV checked, site marked, risks and benefits discussed, surgical consent, monitors and equipment checked, pre-op evaluation and timeout performed  Epidural Patient position: sitting Prep: DuraPrep and site prepped and draped Patient monitoring: continuous pulse ox and blood pressure Approach: midline Location: L4-L5 Injection technique: LOR air  Needle:  Needle type: Tuohy  Needle gauge: 17 G Needle length: 9 cm and 9 Needle insertion depth: 6 cm Catheter type: closed end flexible Catheter size: 19 Gauge Catheter at skin depth: 12 cm Test dose: negative  Assessment Events: blood not aspirated, no cerebrospinal fluid, injection not painful, no injection resistance, no paresthesia and negative IV test

## 2023-07-21 NOTE — Discharge Summary (Signed)
 Postpartum Discharge Summary     Patient Name: Suzanne Haynes DOB: [birth year] MRN: [id number]  Date of admission: 07/20/2023 Delivery date:07/21/2023 Delivering provider: Raynell Caller Date of discharge: 07/23/2023 OB Clinic: [facility]  Admitting diagnosis: early labor with variable decelerations noted. Pregnancy at 39/2.   Additional problems: Marginal cord insertion    Discharge diagnosis: Term Pregnancy Delivered                                              Post partum procedures: None Augmentation: AROM Complications: Moderate meconium. Fetal bradycardia prior to delivery  Hospital course: Patient admitted in early labor with variable decelerations noted. She had a great labor course with AROM used for augmentation. Just prior to delivery, the patient had fetal bradycardia and had an outlet VAVD with 1st degree and peri-uretheral lacerations; 1st degree and left PU repaired.   Patient had an uncomplicated postpartum course.  She is ambulating, tolerating a regular diet, passing flatus, and urinating well. Patient is discharged home in stable condition on 07/23/23.  Newborn Data: Birth date:07/21/2023 Birth time:9:12 AM Gender:Female Living status:Living Apgars:1 ,8  Weight:3450 g  Magnesium Sulfate received: No BMZ received: No Rhophylac:N/A MMR:N/A T-DaP:Given prenatally Flu: No RSV Vaccine received: No Transfusion:No Immunizations administered: Immunization History  Administered Date(s) Administered   DTaP 03/24/2003, 05/21/2003, 07/23/2003, 05/19/2004, 01/29/2007   HIB (PRP-OMP) 03/24/2003, 05/21/2003, 07/23/2003, 05/19/2004   HPV 9-valent 10/26/2015, 11/15/2017   Hepatitis A 01/12/2005, 12/06/2006   Hepatitis B 2002/12/21, 02/17/2003, 10/20/2003   IPV 03/24/2003, 05/21/2003, 10/20/2003, 01/29/2007   Influenza,Quad,Nasal, Live 01/07/2014   Influenza-Unspecified 01/30/2008, 02/02/2009, 02/11/2010   MMR 03/15/2004, 01/29/2007    Meningococcal Conjugate 10/26/2015   Pneumococcal Conjugate-13 03/24/2003, 05/21/2003, 07/23/2003, 05/19/2004   Tdap 06/25/2014   Varicella 03/15/2004, 01/29/2007    Physical exam  Vitals:   07/22/23 0543 07/22/23 1616 07/22/23 2018 07/23/23 0550  BP: (!) 107/53 114/65 122/82 115/72  Pulse: 72 71 81 86  Resp: 18 16 18 16   Temp: 98.3 F (36.8 C) 98.2 F (36.8 C) 97.7 F (36.5 C)   TempSrc: Oral Oral Oral   SpO2: 96% 99% 99% 100%  Weight:      Height:       General: alert, cooperative, and no distress Lochia: appropriate Uterine Fundus: firm Incision: N/A DVT Evaluation: No cords or calf tenderness. No significant calf/ankle edema. Labs: Lab Results  Component Value Date   WBC 30.5 (H) 07/20/2023   HGB 10.9 (L) 07/20/2023   HCT 33.0 (L) 07/20/2023   MCV 87.1 07/20/2023   PLT 293 07/20/2023      Latest Ref Rng & Units 03/18/2023    6:50 PM  CMP  Glucose 70 - 99 mg/dL 98   BUN 6 - 20 mg/dL <5   Creatinine 4.09 - 1.00 mg/dL 8.11   Sodium 914 - 782 mmol/L 136   Potassium 3.5 - 5.1 mmol/L 3.5   Chloride 98 - 111 mmol/L 106   CO2 22 - 32 mmol/L 21   Calcium 8.9 - 10.3 mg/dL 9.0    Edinburgh Score:    07/21/2023   11:00 AM  Edinburgh Postnatal Depression Scale Screening Tool  I have been able to laugh and see the funny side of things. 0  I have looked forward with enjoyment to things. 0  I have blamed myself unnecessarily when things went wrong. 0  I have been anxious or worried for no good reason. 1  I have felt scared or panicky for no good reason. 1  Things have been getting on top of me. 0  I have been so unhappy that I have had difficulty sleeping. 0  I have felt sad or miserable. 0  I have been so unhappy that I have been crying. 0  The thought of harming myself has occurred to me. 0  Edinburgh Postnatal Depression Scale Total 2      After visit meds:  Allergies as of 07/23/2023   No Known Allergies      Medication List     TAKE these  medications    acetaminophen  500 MG tablet Commonly known as: TYLENOL  Take 2 tablets (1,000 mg total) by mouth every 8 (eight) hours as needed (for pain scale < 4).   ferrous sulfate 325 (65 FE) MG EC tablet Take 1 tablet (325 mg total) by mouth daily with breakfast.   fluticasone  50 MCG/ACT nasal spray Commonly known as: FLONASE  Inhale one spray into each nostril once daily for allergy symptom control; rinse mouth and spit after use   ibuprofen  600 MG tablet Commonly known as: ADVIL  Take 1 tablet (600 mg total) by mouth every 6 (six) hours.   multivitamin-prenatal 27-0.8 MG Tabs tablet Take 1 tablet by mouth daily at 12 noon.   oxyCODONE 5 MG immediate release tablet Commonly known as: Oxy IR/ROXICODONE Take 1 tablet (5 mg total) by mouth every 6 (six) hours as needed for severe pain (pain score 7-10) or breakthrough pain.   senna-docusate 8.6-50 MG tablet Commonly known as: Senokot-S Take 2 tablets by mouth at bedtime as needed for mild constipation.         Discharge home in stable condition Infant Feeding: Breast Infant Disposition:NICU Discharge instruction: per After Visit Summary and Postpartum booklet. Activity: Advance as tolerated. Pelvic rest for 6 weeks.  Diet: routine diet Anticipated Birth Control:  None  Future Appointments: Future Appointments  Date Time Provider Department Center  07/26/2023  2:30 PM Granville Layer, MD [facility]      07/23/2023 Melanie Spires, MD

## 2023-07-21 NOTE — Anesthesia Preprocedure Evaluation (Signed)
 Anesthesia Evaluation  Patient identified by MRN, date of birth, ID band Patient awake    Reviewed: Allergy & Precautions, H&P , NPO status , Patient's Chart, lab work & pertinent test results  Airway Mallampati: II  TM Distance: >3 FB Neck ROM: Full    Dental no notable dental hx. (+) Teeth Intact, Dental Advisory Given   Pulmonary neg pulmonary ROS   Pulmonary exam normal breath sounds clear to auscultation       Cardiovascular negative cardio ROS Normal cardiovascular exam Rhythm:Regular Rate:Normal     Neuro/Psych   Anxiety     negative neurological ROS  negative psych ROS   GI/Hepatic negative GI ROS, Neg liver ROS,,,  Endo/Other  negative endocrine ROS    Renal/GU negative Renal ROS  negative genitourinary   Musculoskeletal negative musculoskeletal ROS (+)    Abdominal   Peds negative pediatric ROS (+)  Hematology negative hematology ROS (+)   Anesthesia Other Findings   Reproductive/Obstetrics negative OB ROS                             Anesthesia Physical Anesthesia Plan  ASA: 2  Anesthesia Plan: Epidural   Post-op Pain Management: Minimal or no pain anticipated   Induction: Intravenous  PONV Risk Score and Plan: 2 and Ondansetron   Airway Management Planned: Natural Airway and Simple Face Mask  Additional Equipment: None  Intra-op Plan:   Post-operative Plan:   Informed Consent: I have reviewed the patients History and Physical, chart, labs and discussed the procedure including the risks, benefits and alternatives for the proposed anesthesia with the patient or authorized representative who has indicated his/her understanding and acceptance.     Dental Advisory Given  Plan Discussed with: Anesthesiologist and CRNA  Anesthesia Plan Comments: (Labs checked- platelets confirmed with RN in room. Fetal heart tracing, per RN, reported to be stable enough for sitting  procedure. Discussed epidural, and patient consents to the procedure:  included risk of possible headache,backache, failed block, allergic reaction, and nerve injury. This patient was asked if she had any questions or concerns before the procedure started.)       Anesthesia Quick Evaluation

## 2023-07-21 NOTE — Progress Notes (Signed)
 Suzanne Haynes is a 23 y.o. G1P0 at [redacted]w[redacted]d admitted for induction of labor due to variable decelerations.  Subjective: Pt feeling rectal/vaginal pressure with epidural.  Family at bedside for support.   Objective: BP (!) 119/98   Pulse 89   Temp 98.8 F (37.1 C) (Oral)   Resp 15   Ht 5\' 6"  (1.676 m)   Wt 82.6 kg   LMP 10/18/2022 (Exact Date)   SpO2 100%   BMI 29.38 kg/m  No intake/output data recorded. Total I/O In: -  Out: 75 [Urine:75]  FHT:  FHR: 125 bpm, variability: moderate,  accelerations:  Present,  decelerations:  Absent UC:   regular, every 2 minutes SVE:   Dilation: 10 Effacement (%): 100 Station: Plus 1 Exam by:: Enola Hartigan CNM  Labs: Lab Results  Component Value Date   WBC 30.5 (H) 07/20/2023   HGB 10.9 (L) 07/20/2023   HCT 33.0 (L) 07/20/2023   MCV 87.1 07/20/2023   PLT 293 07/20/2023    Assessment / Plan: Induction/Augmentation of labor, progressing well  Labor:  Pt pushing with contractions Preeclampsia:   n/a Fetal Wellbeing:  Category I Pain Control:  Epidural I/D:   GBS neg Anticipated MOD:  NSVD  Arlester Bence, CNM 07/21/2023, 9:27 AM

## 2023-07-21 NOTE — Progress Notes (Signed)
 LABOR PROGRESS NOTE  Patient Name: Suzanne Haynes, female   DOB: Oct 30, 2000, 23 y.o.  MRN: [id number]  Pt with Cat 1 strip, regular ctx, comfortable after epidural.  Agreeable to AROM after counseling.  AROM with thin meconium staining.  Pt informed.  Mom and baby tolerated well. Can add pitocin  at next check if no change / ctx space.   Ebony Goldstein, MD

## 2023-07-22 NOTE — Progress Notes (Signed)
 Patient ID: Suzanne Haynes, female   DOB: 2000/06/07, 23 y.o.   MRN: [id number]  Post Partum Day One:S/P VD  Subjective: Patient up ad lib, denies syncope or dizziness. Reports consuming regular diet without issues and denies N/V. Denies issues with urination and reports bleeding is "fine."  Patient is feeding via breastmilk by pumping.   Desires no method for postpartum contraception.  Pain is being appropriately managed with use of ibuprofen .  Objective: Vitals:   07/21/23 1520 07/21/23 1946 07/21/23 2337 07/22/23 0543  BP: 119/68 119/68 124/70 (!) 107/53  Pulse: 70 99 70 72  Resp: 18 18 16 18   Temp: 98 F (36.7 C) 98.8 F (37.1 C) 98 F (36.7 C) 98.3 F (36.8 C)  TempSrc:  Oral Oral Oral  SpO2:  100% 100% 96%  Weight:      Height:       Recent Labs    07/20/23 2200  HGB 10.9*  HCT 33.0*    Physical Exam:  General: alert, cooperative, and no distress Mood/Affect: Appropriate/Appropriate Lungs: clear to auscultation, no wheezes, rales or rhonchi, symmetric air entry.  Heart: normal rate and regular rhythm. Breast: not examined. Abdomen:  + bowel sounds, Soft Uterine Fundus: firm, U/-2 Lochia: appropriate Laceration: Not assessed Skin: Warm, Dry DVT Evaluation: No evidence of DVT seen on physical exam. No significant calf/ankle edema.  Assessment S/P Vaginal Delivery-Day One Normal [facility] BreastFeeding Desires Circumcision  Plan: -Patient reports infant in NICU. -Reviewed potential discharge with rooming with baby in NICU if possible. Instructed to speak with NICU team regarding this.  -Postpartum care briefly reviewed. -Circumcision consent completed; see separate note.  -Continue current care -L&D team to be updated on patient status   Kraig Peru, MSN, CNM 07/22/2023, 5:56 AM

## 2023-07-22 NOTE — Lactation Note (Signed)
 This note was copied from a baby's chart.  NICU Lactation Consultation Note  Patient Name: Suzanne Haynes [id number] Date: 07/22/2023 Age:21 hours  Reason for consult: Follow-up assessment; Primapara; 1st time breastfeeding; Breastfeeding assistance; NICU baby; RN request; Term  SUBJECTIVE  RN called to assist with first feeding at the breast for 0900 feeding.  Baby had 10 ml by bottle and 12 ml by gavage finishing up.  Baby swaddled in Mom's arms.    LC reclined Mom and provided pillow support under baby.  Baby placed STS on Mom's breast after hand expressing a drop.  No rooting or cueing noted.  LC will plan to assist at 12 noon feeding.  Mom admitted to not pumping after she was assisted with first pumping yesterday, due to not feeling well.  Encouraged her to pump when she goes back to her room after this feeding and LC will assist at next feeding.  OBJECTIVE Infant data: No data recorded O2 Device: Room Air FiO2 (%): 21 %  Infant feeding assessment IDFTS - Readiness: 2 IDFTS - Quality: 3   Maternal data: G1P1001 Vaginal, Vacuum (Extractor) Has patient been taught Hand Expression?: Yes Hand Expression Comments: easy flow of colostrum Significant Breast History:: ++ breast changes Current breast feeding challenges:: infant admitted to NICU for respiratory support Does the patient have breastfeeding experience prior to this delivery?: No Pumping frequency: Mom hasn't pumped since yesterday, plans to increase frequency today Pumped volume: 3 mL Flange Size: 21 Hands-free pumping top sizes: Small/Medium (Blue) Risk factor for low/delayed milk supply:: infant separation  Pump: Manual  ASSESSMENT Infant: Latch: Too sleepy or reluctant, no latch achieved, no sucking elicited. Audible Swallowing: None Type of Nipple: Everted at rest and after stimulation (short nipple shafts, but compressible areola) Comfort (Breast/Nipple): Soft / non-tender Hold (Positioning): Full  assist, staff holds infant at breast LATCH Score: 4  Feeding Status: Scheduled 9-12-3-6 Feeding method: Breast Nipple Type: Nfant Extra Slow Flow (gold)  Maternal: Milk volume: Normal  INTERVENTIONS/PLAN Interventions: Interventions: Breast feeding basics reviewed; Assisted with latch; Skin to skin; Breast massage; Hand express; Adjust position; Support pillows; Position options; DEBP; Hand pump Tools: Pump; Flanges; Hands-free pumping top; Bottle Pump Education: Setup, frequency, and cleaning; Milk Storage  Plan: 1- STS 2- Offer the breast with feeding cues, LC to assist 3-Pump both breasts after breastfeeding or attempts until baby is actively latching and feeding at the breast.  Consult Status: NICU follow-up NICU Follow-up type: Verify absence of engorgement; Verify onset of copious milk; Verify DEBP issuance   Suzanne Haynes 07/22/2023, 9:22 AM

## 2023-07-22 NOTE — Progress Notes (Signed)
   CIRCUMCISION CONSENT  Patient and SO expresses desire for infant circumcision.  Informed that [facility] can perform said procedure and circumcision procedure details discussed.    -It was emphasized that this is an elective procedure.   -Risks and benefits of procedure were reviewed including, but not limited to:  *Benefits include reduction in the rates of urinary tract infection (UTI), penile cancer, some sexually transmitted infections, penile inflammatory, and retractile disorders, as well as easier hygiene.   *Risks include bleeding, infection, injury of glans which may lead to need for additional surgery, penile deformity, or urinary tract issues, unsatisfactory cosmetic appearance and other potential complications related to the procedure.   -Informed that procedure will not be performed if provider deems inappropriate d/t penile size, noted deformity, or unsatisfactory pediatric evaluation. -Patient wants to proceed with circumcision. -Circumcision to be done pending pediatric evaluation of infant.  -Post circumcision care discussed. -L&D Team updated  Kraig Peru MSN, CNM Advanced Practice Provider, [facility] 07/22/2023 8:14 AM

## 2023-07-22 NOTE — Lactation Note (Signed)
 This note was copied from a baby's chart.  NICU Lactation Consultation Note  Patient Name: Suzanne Haynes ZOXWR'U Date: 07/22/2023 Age:21 hours  Reason for consult: Follow-up assessment; RN request; Mother's request; NICU baby; Primapara; 1st time breastfeeding; Term  SUBJECTIVE  LC in to assist Mom at the 12 noon feeding.  Baby showing vigorous feeding cues.  LC provided pillow support and reclined Mom in chair.  LC assisted Mom in supporting and shaping her breast and supporting baby's head from ear to ear.  Baby able to attain a deep areolar latch after a couple attempts.  Baby sustained the latch for 10 mins, hearing swallows and seeing deep jaw extensions.  Mom denied any pinching feeding.  Basic breastfeeding education shared and Mom was feeling wonderful during and after baby's breastfeeding.  Mom encouraged to pump after breastfeeding to support her milk supply, if baby is supplemented after breastfeeding.  Baby is now on IDF and was gavage fed 7 ml of EBM.  Mom plans to remain an IP through tomorrow.  Mom will pump consistently to prevent engorgement and support a full milk supply.  If baby feeds at the breast for >15 mins, no supplement needed and Mom doesn't need to pump at that time.  OBJECTIVE Infant data: No data recorded O2 Device: Room Air FiO2 (%): 21 %  Infant feeding assessment IDFTS - Readiness: 2 IDFTS - Quality: 2   Maternal data: G1P1001 Vaginal, Vacuum (Extractor) Has patient been taught Hand Expression?: Yes Hand Expression Comments: easy flow of colostrum Significant Breast History:: ++ breast changes Current breast feeding challenges:: infant admitted to NICU for respiratory support Does the patient have breastfeeding experience prior to this delivery?: No Pumping frequency: 8 times per 24 hrs. Pumped volume: 25 mL Flange Size: 21 Hands-free pumping top sizes: Small/Medium (Blue) Risk factor for low/delayed milk supply:: infant  separation  Pump: Advised to call insurance company, Hands Free, Personal (MomCozy hands free pump) LC resubmitted STORK pump request as Mom carries Medicaid [employer].  ASSESSMENT Infant: Latch: Grasps breast easily, tongue down, lips flanged, rhythmical sucking. (after a few attempts) Audible Swallowing: Spontaneous and intermittent Type of Nipple: Everted at rest and after stimulation Comfort (Breast/Nipple): Soft / non-tender Hold (Positioning): Assistance needed to correctly position infant at breast and maintain latch. LATCH Score: 9  Feeding Status: Scheduled 9-12-3-6 Feeding method: Breast Nipple Type: Nfant Extra Slow Flow (gold)  Maternal: Milk volume: Normal  INTERVENTIONS/PLAN Interventions: Interventions: Breast feeding basics reviewed; Assisted with latch; Skin to skin; Breast massage; Hand express; Breast compression; Adjust position; Support pillows; Position options; Expressed milk; DEBP; Education Tools: Pump; Flanges; Hands-free pumping top Pump Education: Setup, frequency, and cleaning; Milk Storage  Plan: Consult Status: NICU follow-up NICU Follow-up type: Verify absence of engorgement; Verify onset of copious milk; Assist with IDF-2 (Mother does not need to pre-pump before breastfeeding)   Dario Edison 07/22/2023, 1:03 PM

## 2023-07-22 NOTE — Anesthesia Postprocedure Evaluation (Signed)
 Anesthesia Post Note  Patient: Suzanne Haynes, Suzanne (consulting)  Procedure(s) Performed: AN AD HOC LABOR EPIDURAL     Patient location during evaluation: Mother Baby Anesthesia Type: Epidural Level of consciousness: awake and alert Pain management: pain level controlled Vital Signs Assessment: post-procedure vital signs reviewed and stable Respiratory status: spontaneous breathing, nonlabored ventilation and respiratory function stable Cardiovascular status: stable Postop Assessment: no headache, no backache, epidural receding and able to ambulate Anesthetic complications: no   No notable events documented.  Last Vitals:  Vitals:   07/21/23 2337 07/22/23 0543  BP: 124/70 (!) 107/53  Pulse: 70 72  Resp: 16 18  Temp: 36.7 C 36.8 C  SpO2: 100% 96%    Last Pain:  Vitals:   07/22/23 1104  TempSrc:   PainSc: 0-No pain   Pain Goal: Patients Stated Pain Goal: 0 (07/20/23 2011)                 Suzanne Haynes

## 2023-07-23 ENCOUNTER — Ambulatory Visit (HOSPITAL_COMMUNITY): Payer: Self-pay

## 2023-07-23 MED ORDER — FERROUS SULFATE 325 (65 FE) MG PO TBEC: 325.0000 mg | DELAYED_RELEASE_TABLET | Freq: Every day | ORAL | 3 refills | Status: DC

## 2023-07-23 MED ORDER — SENNOSIDES-DOCUSATE SODIUM 8.6-50 MG PO TABS: 2.0000 | ORAL_TABLET | Freq: Every evening | ORAL | 0 refills | Status: DC | PRN

## 2023-07-23 MED ORDER — IBUPROFEN 600 MG PO TABS: 600.0000 mg | ORAL_TABLET | Freq: Four times a day (QID) | ORAL | 0 refills | Status: DC

## 2023-07-23 MED ORDER — OXYCODONE HCL 5 MG PO TABS: 5.0000 mg | ORAL_TABLET | Freq: Four times a day (QID) | ORAL | 0 refills | Status: DC | PRN

## 2023-07-23 MED ORDER — ACETAMINOPHEN 500 MG PO TABS: 1000.0000 mg | ORAL_TABLET | Freq: Three times a day (TID) | ORAL | 0 refills | Status: DC | PRN

## 2023-07-23 NOTE — Lactation Note (Signed)
 This note was copied from a baby's chart.  NICU Lactation Consultation Note  Patient Name: Suzanne Haynes ZOXWR'U Date: 07/23/2023 Age:21 hours  Reason for consult: Follow-up assessment; Primapara; 1st time breastfeeding; NICU baby; Term; Breastfeeding assistance; RN request; Mother's request; Maternal discharge  SUBJECTIVE LC in to assist/assess baby "Suzanne Haynes" in the NICU.  Baby was just finishing a 15 min feeding on Mom's left breast.  Baby came off on his own, nipple rounded and not pinched.  Baby burped and Mom was able to latch baby to right breast.  LC recommended supporting/shaping her breast while baby is still early learning to breastfeed.  Baby immediately opened his mouth wide and latched deeply.  Mom assisted to bring baby on quickly, leading with his chin into breast and neck straight.  FOB shown how to assess lower lip for flanging, which it was.  Baby settled into a nutritive suck pattern and Mom relaxed and denies any pain.  Mom encouraged to offer the breast at each feeding.  If baby is supplemented, LC recommended pumping at that feeding, but otherwise, to back off the consistent pumping now that baby is latching and Mom will be offering the breast at each feeding.  Mom received her STORK pump.   Engorgement prevention and treatment reviewed.  OBJECTIVE Infant data: No data recorded O2 Device: Room Air  Infant feeding assessment IDFTS - Readiness: 2 IDFTS - Quality: 2   Maternal data: G1P1001 Vaginal, Vacuum (Extractor) Pumping frequency: 8 times per 24 hrs Pumped volume: 35 mL Flange Size: 21 Hands-free pumping top sizes: Small/Medium (Blue)  Pump: Received Stork Pump (Spectra )  ASSESSMENT Infant: Latch: Grasps breast easily, tongue down, lips flanged, rhythmical sucking. Audible Swallowing: Spontaneous and intermittent Type of Nipple: Everted at rest and after stimulation Comfort (Breast/Nipple): Soft / non-tender (breasts filling) Hold (Positioning):  Assistance needed to correctly position infant at breast and maintain latch. (minimal assistance by [facility]) LATCH Score: 9  Feeding Status: Scheduled 9-12-3-6 Feeding method: Breast Nipple Type: Dr. Michelene Ahmadi Preemie (wide base)  Maternal: Milk volume: Normal  INTERVENTIONS/PLAN Interventions: Interventions: Assisted with latch; Skin to skin; Breast massage; Adjust position; Position options; DEBP; Education Discharge Education: Engorgement and breast care; Outpatient recommendation Tools: Pump; Flanges; Hands-free pumping top Pump Education: Setup, frequency, and cleaning; Milk Storage  Plan: 1- STS with baby, offering the breast often with feeding cues 2- Mom to pump breasts prn or if baby is supplemented by gavage or bottle 3- Ask for lactation prn  Consult Status: NICU follow-up NICU Follow-up type: Verify absence of engorgement; Verify onset of copious milk   Suzanne Haynes 07/23/2023, 9:37 AM

## 2023-07-23 NOTE — Lactation Note (Addendum)
 This note was copied from a baby's chart.  NICU Lactation Consultation Note  Patient Name: Suzanne Haynes ZOXWR'U Date: 07/23/2023 Age:21 hours  Reason for consult: Breastfeeding assistance; Primapara; 1st time breastfeeding; NICU baby; Term  SUBJECTIVE  LC in to do a pre and post weight.   Baby latched easily on right breast in cross cradle hold and fed for 10 mins, burped and then latched with (LC hands off) onto left breast.  Both latches were deep and comfortable.  Baby sucking with deep jaw extensions and swallowing audible.  12 min feeding at the breast- Pre=weight 3440 gm Post-weight 3480 gm  Baby post weight increase of 40 ml in 12 mins of nutritive breastfeeding.  Mom and FOB thrilled and baby contented dozing.    OBJECTIVE Infant data: No data recorded O2 Device: Room Air  Infant feeding assessment IDFTS - Readiness: 1 IDFTS - Quality: 2   Maternal data: G1P1001 Vaginal, Vacuum (Extractor) Pumping frequency: Mom to pump for comfort or pump if baby is fed by bottle to supplement Pumped volume: 35 mL Flange Size: 21 Hands-free pumping top sizes: Small/Medium (Blue)  Pump: Received Stork Pump (Spectra )  ASSESSMENT Infant: Latch: Grasps breast easily, tongue down, lips flanged, rhythmical sucking. Audible Swallowing: Spontaneous and intermittent Type of Nipple: Everted at rest and after stimulation Comfort (Breast/Nipple): Soft / non-tender (filling) Hold (Positioning): Assistance needed to correctly position infant at breast and maintain latch. (minimal assistance, verbal instructions) LATCH Score: 9  Feeding Status: (S) Ad lib Feeding method: Breast Nipple Type: Dr. Michelene Ahmadi Preemie (wide base)  Maternal: Milk volume: Normal  INTERVENTIONS/PLAN Interventions: Interventions: Breast feeding basics reviewed; Skin to skin; Breast massage; Hand express; Assisted with latch; Support pillows; Position options; Expressed milk; DEBP; Education Discharge  Education: Engorgement and breast care Tools: Pump; Flanges; Hands-free pumping top Pump Education: Setup, frequency, and cleaning; Milk Storage  Plan: 1- Offer the breast often with cues, goal is 8 times per 24 hrs 2- Latch with a deep latch to the breast, using gentle breast compression during sucking to increase milk transfer 3- Pump only for comfort if breasts are full and painful, or baby is supplemented by bottle.  Consult Status: NICU follow-up NICU Follow-up type: Baby's discharge   Dario Edison 07/23/2023, 3:50 PM

## 2023-07-24 ENCOUNTER — Ambulatory Visit (HOSPITAL_COMMUNITY): Payer: Self-pay

## 2023-07-24 NOTE — Lactation Note (Signed)
 This note was copied from a baby's chart.  NICU Lactation Consultation Note  Patient Name: Suzanne Haynes Date: 07/24/2023 Age:21 hours  Reason for consult: Follow-up assessment; Primapara; 1st time breastfeeding; NICU baby; Term  SUBJECTIVE Visited with family of 51 4/38 weeks old [facility] NICU female; Ms. Suzanne Haynes is a P1 and reported she's been mostly pumping and bottle feeding during NICU stay, her milk is already in, praised her for her efforts. Noticed that pumping hasn't been consistent (see maternal assessment). Parents are taking baby "Suzanne Haynes" home today. Reviewed discharge education and the importance of consistent pumping to protect her supply; she's unsure about taking baby to breast after going home but will definitely continue pumping and bottle feeding until she makes up her mind. Discussed about getting inserts for her Spectra  pump at home. Parents politely declined a referral to [facility] OP; they plan on following up with the LC at [facility], FOB said baby's pediatrician used to be his own. FOB present and very supportive. All questions and concerns answered, family to contact [facility] services PRN.  OBJECTIVE Infant data: Mother's Current Feeding Choice: Breast Milk  O2 Device: Room Air  Infant feeding assessment IDFTS - Readiness: 2 IDFTS - Quality: 2   Maternal data: G1P1001 Vaginal, Vacuum (Extractor) Pumping frequency: 3-4 times/24 hours Pumped volume: 75 mL (75-90 ml) Flange Size: 21 Hands-free pumping top sizes: Small/Medium (Blue)  Pump: Received Stork Pump (Spectra )  ASSESSMENT Infant: Feeding Status: Ad lib Feeding method: Bottle Nipple Type: Dr. Leticia Raven Preemie (wide)  Maternal: Milk volume: Normal No S/S of engorgement at this time; but mom voiced having a slower "flow" on her L side  INTERVENTIONS/PLAN Interventions: Interventions: Breast feeding basics reviewed; DEBP; Education Discharge Education: Outpatient recommendation Tools: Pump;  Flanges; Hands-free pumping top Pump Education: Setup, frequency, and cleaning; Milk Storage  Plan: Consult Status: Complete NICU Follow-up type: Baby's discharge   Carrina Schoenberger Newman Bare 07/24/2023, 4:45 PM

## 2023-07-26 ENCOUNTER — Encounter: Admitting: Family Medicine

## 2023-08-01 ENCOUNTER — Inpatient Hospital Stay (HOSPITAL_COMMUNITY): Admission: RE | Admit: 2023-08-01 | Source: Home / Self Care | Admitting: Family Medicine

## 2023-08-01 ENCOUNTER — Inpatient Hospital Stay (HOSPITAL_COMMUNITY)

## 2023-08-01 ENCOUNTER — Telehealth (HOSPITAL_COMMUNITY): Payer: Self-pay | Admitting: *Deleted

## 2023-08-01 NOTE — Telephone Encounter (Signed)
 08/01/2023  Name: Quinnley Eastland MRN: [id number] DOB: 08-Oct-2000  Reason for Call:  Transition of Care Hospital Discharge Call  Contact Status: Patient Contact Status: Message  Language assistant needed:          Follow-Up Questions:    Dimple Francis Postnatal Depression Scale:  In the Past 7 Days:    PHQ2-9 Depression Scale:     Discharge Follow-up:    Post-discharge interventions: NA  Phelan Schadt,RN 08/01/2023 1852

## 2023-08-24 ENCOUNTER — Encounter: Payer: Self-pay | Admitting: Family Medicine

## 2023-08-31 ENCOUNTER — Encounter: Payer: Self-pay | Admitting: Family Medicine

## 2023-08-31 ENCOUNTER — Encounter (HOSPITAL_COMMUNITY): Payer: Self-pay | Admitting: *Deleted

## 2023-08-31 ENCOUNTER — Other Ambulatory Visit: Payer: Self-pay

## 2023-08-31 ENCOUNTER — Inpatient Hospital Stay (HOSPITAL_COMMUNITY)
Admission: EM | Admit: 2023-08-31 | Discharge: 2023-08-31 | Disposition: A | Discharge status: Home / Self Care | Attending: Obstetrics and Gynecology | Admitting: Obstetrics and Gynecology

## 2023-08-31 DIAGNOSIS — R102 Pelvic and perineal pain: Secondary | ICD-10-CM | POA: Diagnosis not present

## 2023-08-31 DIAGNOSIS — N8110 Cystocele, unspecified: Secondary | ICD-10-CM | POA: Diagnosis present

## 2023-08-31 DIAGNOSIS — N368 Other specified disorders of urethra: Secondary | ICD-10-CM

## 2023-08-31 DIAGNOSIS — Z711 Person with feared health complaint in whom no diagnosis is made: Secondary | ICD-10-CM | POA: Insufficient documentation

## 2023-08-31 DIAGNOSIS — O9089 Other complications of the puerperium, not elsewhere classified: Secondary | ICD-10-CM | POA: Diagnosis present

## 2023-08-31 LAB — URINALYSIS, ROUTINE W REFLEX MICROSCOPIC
Bacteria, UA: NONE SEEN
Bilirubin Urine: NEGATIVE
Glucose, UA: NEGATIVE mg/dL
Ketones, ur: NEGATIVE mg/dL
Leukocytes,Ua: NEGATIVE
Nitrite: NEGATIVE
Protein, ur: NEGATIVE mg/dL
Specific Gravity, Urine: 1.003 — ABNORMAL LOW (ref 1.005–1.030)
pH: 8 (ref 5.0–8.0)

## 2023-08-31 NOTE — MAU Note (Addendum)
 Suzanne Haynes is a 23 y.o. postpartum  here in MAU reporting vaginal prolapse. She reports having vag bleeding about 4wks pp which OB told her was probably her period. Bleeding stopped yesterday and then started today at 1600. Having some lower abd cramping since bleeding started. Noticed the prolapse soon after delivery and she messaged her OB. Today pt states the cervix looked lower. She notices this when sitting on the toilet. Reports no difficulty with having BM or urination  LMP: 08/20/23 Onset of complaint: few wks Pain score: 3 Vitals:   08/31/23 1946 08/31/23 1948  BP:  114/81  Pulse: 91   Resp: 16   Temp: 97.9 F (36.6 C)   SpO2: 100%      FHT: n/a  Lab orders placed from triage: u/a

## 2023-08-31 NOTE — ED Triage Notes (Signed)
 Pt reports that when she squats she can see her "cervix" coming out of her vagina.  She states that when she looks while on the toilet she can also see it.  Pt had a vacuum assisted vaginal delivery 4/25.  She is 6 weeks postpartum

## 2023-08-31 NOTE — Progress Notes (Signed)
Jessica Emly CNM in earlier to discuss d/c plan. Written and verbal d/c instructions given and understanding voiced 

## 2023-08-31 NOTE — ED Provider Triage Note (Signed)
 Emergency Medicine Provider Triage Evaluation Note  Marshella Tello , a 23 y.o. female  was evaluated in triage.  Pt complains of pelvic cramping, intermittent vaginal bleeding, and pelvic organ prolapse. Vaginal delivery nearly 6 weeks ago. I spoke with MAU provider who agreed to see patient in MAU.  Review of Systems  Positive:  Negative:   Physical Exam  BP (!) 141/90 (BP Location: Right Arm)   Pulse (!) 114   Temp 98 F (36.7 C)   Resp 14   LMP 10/18/2022 (Exact Date)   SpO2 100%   Breastfeeding No  Gen:   Awake, no distress   Resp:  Normal effort  MSK:   Moves extremities without difficulty  Other:    Medical Decision Making  Medically screening exam initiated at 7:32 PM.  Appropriate orders placed.  Maurianna Ulibarri was informed that the remainder of the evaluation will be completed by another provider, this initial triage assessment does not replace that evaluation, and the importance of remaining in the ED until their evaluation is complete.     Shannon Bureau, [place] 08/31/23 [phone]

## 2023-08-31 NOTE — ED Notes (Signed)
 MAU RN notified on patient's status /transfer. PA evaluated patient at triage and reported to MAU provider.

## 2023-08-31 NOTE — MAU Provider Note (Signed)
 History     CSN: [id number]  Arrival date and time: 08/31/23 1851   Event Date/Time   First Provider Initiated Contact with Patient 08/31/23 2145      Chief Complaint  Patient presents with   Vaginal Prolapse    Suzanne Haynes is a 23 y.o. G1P1001 at 6 weeks Postpartum who receives care at [facility]. Patient reports next appt is Tuesday June 10th. She presents today for prolapse.  She states she can see "my cervix at the opening of my vagina if I open it a little bit."  She reports some intermittent cramping, but states it is manageable.  She reports that she stopped bleeding at 3 weeks postpartum and it restarted 4-5 days later.  She states she stopped bleeding yesterday, but has bleeding again today. She states the blood was dark red prior to stopping, but today is bright red.   Patient reports sexual activity once that was without pain or discomfort.   OB History     Gravida  1   Para  1   Term  1   Preterm      AB      Living  1      SAB      IAB      Ectopic      Multiple  0   Live Births  1           Past Medical History:  Diagnosis Date   Anxiety    Medical history non-contributory     Past Surgical History:  Procedure Laterality Date   FOOT SURGERY Right    growth shaved down or taken out; 3d Grade   TONSILLECTOMY     age 4/6    Family History  Problem Relation Age of Onset   Depression Mother    Anxiety disorder Mother    Scoliosis Mother    Depression Father    Drug abuse Father    Alcoholism Father    ODD Sister    ADD / ADHD Sister    Paranoid behavior Brother    Schizophrenia Brother    ADD / ADHD Brother    ODD Brother    Healthy Maternal Grandmother    Healthy Maternal Grandfather    Healthy Paternal Grandmother    Healthy Paternal Grandfather     Social History   Tobacco Use   Smoking status: Never   Smokeless tobacco: Never   Tobacco comments:    mom and stepdad smoke inside and out of house  Vaping Use   Vaping  status: Every Day  Substance Use Topics   Alcohol use: Not Currently   Drug use: Never    Allergies: No Known Allergies  Medications Prior to Admission  Medication Sig Dispense Refill Last Dose/Taking   acetaminophen  (TYLENOL ) 500 MG tablet Take 2 tablets (1,000 mg total) by mouth every 8 (eight) hours as needed (for pain scale < 4). 60 tablet 0 Past Week   ibuprofen  (ADVIL ) 600 MG tablet Take 1 tablet (600 mg total) by mouth every 6 (six) hours. 40 tablet 0 Past Month   Prenatal Vit-Fe Fumarate-FA (MULTIVITAMIN-PRENATAL) 27-0.8 MG TABS tablet Take 1 tablet by mouth daily at 12 noon. 90 tablet 4 08/30/2023   ferrous sulfate  325 (65 FE) MG EC tablet Take 1 tablet (325 mg total) by mouth daily with breakfast. (Patient not taking: Reported on 08/31/2023) 90 tablet 3 Not Taking   fluticasone  (FLONASE ) 50 MCG/ACT nasal spray Inhale one spray into each nostril  once daily for allergy symptom control; rinse mouth and spit after use (Patient not taking: Reported on 05/24/2023) 16 g 12    oxyCODONE  (OXY IR/ROXICODONE ) 5 MG immediate release tablet Take 1 tablet (5 mg total) by mouth every 6 (six) hours as needed for severe pain (pain score 7-10) or breakthrough pain. (Patient not taking: Reported on 08/31/2023) 10 tablet 0 Not Taking   senna-docusate (SENOKOT-S) 8.6-50 MG tablet Take 2 tablets by mouth at bedtime as needed for mild constipation. (Patient not taking: Reported on 08/31/2023) 60 tablet 0 Not Taking    Review of Systems  Gastrointestinal:  Negative for constipation, diarrhea, nausea and vomiting.  Genitourinary:  Positive for pelvic pain (Cramping) and vaginal bleeding. Negative for difficulty urinating and dysuria.   Physical Exam   Blood pressure 114/81, pulse 91, temperature 97.9 F (36.6 C), resp. rate 16, height 5\' 6"  (1.676 m), weight 73.5 kg, SpO2 100%, not currently breastfeeding.  Physical Exam Vitals reviewed. Exam conducted with a chaperone present Britta Candy, NT).  Constitutional:       Appearance: Normal appearance.  HENT:     Head: Normocephalic and atraumatic.  Eyes:     Conjunctiva/sclera: Conjunctivae normal.  Cardiovascular:     Rate and Rhythm: Normal rate.  Pulmonary:     Effort: Pulmonary effort is normal. No respiratory distress.  Genitourinary:    General: Normal vulva.     Comments: NEFG No prolapse noted with valsalva. Urethra with some enlargement. Speculum exam with scant blood noted. No active bleeding from cervix which is pink and without lesions or cysts.   BME: No uterine tenderness or apparent enlargement. Weak muscle tone. Musculoskeletal:        General: Normal range of motion.     Cervical back: Normal range of motion.  Skin:    General: Skin is warm and dry.  Neurological:     Mental Status: She is alert and oriented to person, place, and time.  Psychiatric:        Mood and Affect: Mood normal.        Behavior: Behavior normal.     MAU Course  Procedures Results for orders placed or performed during the hospital encounter of 08/31/23 (from the past 24 hours)  Urinalysis, Routine w reflex microscopic -Urine, Clean Catch     Status: Abnormal   Collection Time: 08/31/23  8:00 PM  Result Value Ref Range   Color, Urine COLORLESS (A) YELLOW   APPearance CLEAR CLEAR   Specific Gravity, Urine 1.003 (L) 1.005 - 1.030   pH 8.0 5.0 - 8.0   Glucose, UA NEGATIVE NEGATIVE mg/dL   Hgb urine dipstick LARGE (A) NEGATIVE   Bilirubin Urine NEGATIVE NEGATIVE   Ketones, ur NEGATIVE NEGATIVE mg/dL   Protein, ur NEGATIVE NEGATIVE mg/dL   Nitrite NEGATIVE NEGATIVE   Leukocytes,Ua NEGATIVE NEGATIVE   RBC / HPF 0-5 0 - 5 RBC/hpf   WBC, UA 0-5 0 - 5 WBC/hpf   Bacteria, UA NONE SEEN NONE SEEN   Squamous Epithelial / HPF 0-5 0 - 5 /HPF    MDM Physical Exam Reassurance Assessment and Plan  23 year old Postpartum State Physically Well But Worried   -Reviewed POC with patient. -Exam performed and findings discussed.  -Reassured that no  prolapse and what she is visualizing is actually her urethra and part of her vaginal vault (anterior). -Reviewed Kegel exercises and how this could improve vaginal muscle tone. -Reassured that postpartum bleeding can occur up to 8 weeks and concerning  with large amounts or unmanageable cramps. Encouraged to monitor. -Instructed to keep next appt as scheduled. -Encouraged to call primary office or return to MAU if symptoms worsen or with the onset of new symptoms. -Discharged to home in stable condition.    Kraig Peru MSN, CNM Advanced Practice Provider, Center for [facility] 08/31/2023, 9:45 PM

## 2023-09-05 ENCOUNTER — Ambulatory Visit: Admitting: Family Medicine

## 2023-09-05 NOTE — Progress Notes (Signed)
 Post Partum Visit Note  Suzanne Haynes is a 23 y.o. G47P1001 female who presents for a postpartum visit. She is 6 weeks postpartum following a vacuum-assisted vaginal delivery.  I have fully reviewed the prenatal and intrapartum course. The delivery was at 39.3 gestational weeks.  Anesthesia: local and epidural. Postpartum course has been uncomplicated. Baby is doing well. Baby is feeding by bottle - Similac Total Comfort. Bleeding no bleeding. Bowel function is normal. Bladder function is normal. Patient is sexually active. Contraception method is condoms. Postpartum depression screening: negative.   The pregnancy intention screening data noted above was reviewed. Potential methods of contraception were discussed. The patient elected to proceed with No data recorded.   Edinburgh Postnatal Depression Scale - 09/05/23 1055       Edinburgh Postnatal Depression Scale:  In the Past 7 Days   I have been able to laugh and see the funny side of things. 0    I have looked forward with enjoyment to things. 0    I have blamed myself unnecessarily when things went wrong. 1    I have been anxious or worried for no good reason. 1    I have felt scared or panicky for no good reason. 1    Things have been getting on top of me. 1    I have been so unhappy that I have had difficulty sleeping. 0    I have felt sad or miserable. 1    I have been so unhappy that I have been crying. 1    The thought of harming myself has occurred to me. 0    Edinburgh Postnatal Depression Scale Total 6             Health Maintenance Due  Topic Date Due   Meningococcal B Vaccine (1 of 2 - Standard) Never done   COVID-19 Vaccine (1 - 2024-25 season) Never done    The following portions of the patient's history were reviewed and updated as appropriate: allergies, current medications, past family history, past medical history, past social history, past surgical history, and problem list.  Review of  Systems Pertinent items noted in HPI and remainder of comprehensive ROS otherwise negative.  Objective:  BP 116/75   Pulse 68   Wt 165 lb (74.8 kg)   LMP 10/18/2022 (Exact Date)   Breastfeeding No   BMI 26.63 kg/m    General:  alert, cooperative, and appears stated age   Breasts:  not indicated  Lungs: Normal effort  Heart:  regular rate and rhythm  Abdomen: soft, non-tender; bowel sounds normal; no masses,  no organomegaly   GU exam:  normal       Assessment:   Normal  postpartum exam.   Plan:   Essential components of care per ACOG recommendations:  1.  Mood and well being: Patient with mixed depression screening today. Reviewed local resources for support. She feels like she is ok. - Patient tobacco use? Yes. Patient desires to quit? Yes.Discussed reduction and cessation  - hx of drug use? No.    2. Infant care and feeding:  -Patient currently breastmilk feeding? No.  -Social determinants of health (SDOH) reviewed in EPIC. No concerns  3. Sexuality, contraception and birth spacing - Patient does not want a pregnancy in the next year.  Desired family size is 2 children.  - Reviewed reproductive life planning. Reviewed contraceptive methods based on pt preferences and effectiveness.  Patient desired Female Condom today.   - Discussed  birth spacing of 18 months  4. Sleep and fatigue -Encouraged family/partner/community support of 4 hrs of uninterrupted sleep to help with mood and fatigue  5. Physical Recovery  - Discussed patients delivery and complications. She describes her labor as good. - Patient had a Vaginal, no problems at delivery. Patient had a periurethral laceration. Perineal healing reviewed. Patient expressed understanding - Patient has urinary incontinence? No. - Patient is safe to resume physical and sexual activity  6.  Health Maintenance - HM due items addressed Yes - Last pap smear No results found for: "DIAGPAP" Pap smear not done at today's visit  due to age.  -Breast Cancer screening indicated? No.   7. Chronic Disease/Pregnancy Condition follow up: None  Return in about 4 months (around 01/05/2024) for a CPE.   Granville Layer, MD Center for [employer], [facility]

## 2024-05-02 ENCOUNTER — Ambulatory Visit: Admitting: Family Medicine

## 2024-05-02 ENCOUNTER — Encounter: Payer: Self-pay | Admitting: Family Medicine

## 2024-05-02 VITALS — BP 115/78 | HR 102 | Wt 173.0 lb

## 2024-05-02 DIAGNOSIS — Z01419 Encounter for gynecological examination (general) (routine) without abnormal findings: Secondary | ICD-10-CM | POA: Diagnosis not present

## 2024-05-02 DIAGNOSIS — Z124 Encounter for screening for malignant neoplasm of cervix: Secondary | ICD-10-CM

## 2024-05-02 NOTE — Patient Instructions (Signed)

## 2024-05-02 NOTE — Progress Notes (Signed)
 Subjective:     Suzanne Haynes is a 24 y.o. female and is here for a comprehensive physical exam. The patient reports no problems.   The following portions of the patient's history were reviewed and updated as appropriate: allergies, current medications, past family history, past medical history, past social history, past surgical history, and problem list.  Review of Systems Pertinent items noted in HPI and remainder of comprehensive ROS otherwise negative.   Objective:  Chaperone present for exam   BP 115/78   Pulse (!) 102   Wt 173 lb (78.5 kg)   LMP 04/23/2024   Breastfeeding No   BMI 27.92 kg/m  General appearance: alert, cooperative, and appears stated age Head: Normocephalic, without obvious abnormality, atraumatic Neck: no adenopathy, supple, symmetrical, trachea midline, and thyroid not enlarged, symmetric, no tenderness/mass/nodules Lungs: clear to auscultation bilaterally Breasts: normal appearance, no masses or tenderness Heart: regular rate and rhythm, S1, S2 normal, no murmur, click, rub or gallop Abdomen: soft, non-tender; bowel sounds normal; no masses,  no organomegaly Pelvic: cervix normal in appearance, external genitalia normal, no adnexal masses or tenderness, no cervical motion tenderness, uterus normal size, shape, and consistency, and vagina normal without discharge Extremities: extremities normal, atraumatic, no cyanosis or edema Pulses: 2+ and symmetric Skin: Skin color, texture, turgor normal. No rashes or lesions Lymph nodes: Cervical, supraclavicular, and axillary nodes normal. Neurologic: Grossly normal       05/02/2024    3:28 PM  GAD 7 : Generalized Anxiety Score  Nervous, Anxious, on Edge 0  Control/stop worrying 0  Worry too much - different things 0  Trouble relaxing 0  Restless 0  Easily annoyed or irritable 0  Afraid - awful might happen 0  Total GAD 7 Score 0    Flowsheet Row Office Visit from 05/02/2024 in [facility] for  [facility]  PHQ-9 Total Score 0    Assessment:    Healthy female exam.      Plan:  Encounter for gynecological examination without abnormal finding - No need for labs due to age. Declined flu. PHQ9 and GAD7 reviewed. - Plan: CANCELED: Cervicovaginal ancillary only( [place])  Screening for malignant neoplasm of cervix - Plan: Cytology - PAP( )    See After Visit Summary for Counseling Recommendations
